# Patient Record
Sex: Female | Born: 1990 | Race: White | Hispanic: No | Marital: Married | Smoking: Never smoker
Health system: Southern US, Community
[De-identification: ages and names within clinical notes are randomized; demographics above are authoritative.]

## PROBLEM LIST (undated history)

## (undated) DIAGNOSIS — E7800 Pure hypercholesterolemia, unspecified: Secondary | ICD-10-CM

## (undated) DIAGNOSIS — F419 Anxiety disorder, unspecified: Secondary | ICD-10-CM

## (undated) DIAGNOSIS — J302 Other seasonal allergic rhinitis: Secondary | ICD-10-CM

## (undated) DIAGNOSIS — L409 Psoriasis, unspecified: Secondary | ICD-10-CM

## (undated) DIAGNOSIS — Z86718 Personal history of other venous thrombosis and embolism: Secondary | ICD-10-CM

## (undated) DIAGNOSIS — G4730 Sleep apnea, unspecified: Secondary | ICD-10-CM

## (undated) DIAGNOSIS — J45909 Unspecified asthma, uncomplicated: Secondary | ICD-10-CM

## (undated) DIAGNOSIS — F32A Depression, unspecified: Secondary | ICD-10-CM

## (undated) DIAGNOSIS — N941 Unspecified dyspareunia: Secondary | ICD-10-CM

## (undated) DIAGNOSIS — M1990 Unspecified osteoarthritis, unspecified site: Secondary | ICD-10-CM

## (undated) DIAGNOSIS — T7840XA Allergy, unspecified, initial encounter: Secondary | ICD-10-CM

## (undated) DIAGNOSIS — N3010 Interstitial cystitis (chronic) without hematuria: Secondary | ICD-10-CM

## (undated) DIAGNOSIS — E559 Vitamin D deficiency, unspecified: Secondary | ICD-10-CM

## (undated) DIAGNOSIS — Z8619 Personal history of other infectious and parasitic diseases: Secondary | ICD-10-CM

## (undated) DIAGNOSIS — E538 Deficiency of other specified B group vitamins: Secondary | ICD-10-CM

## (undated) HISTORY — DX: Unspecified osteoarthritis, unspecified site: M19.90

## (undated) HISTORY — DX: Depression, unspecified: F32.A

## (undated) HISTORY — DX: Allergy, unspecified, initial encounter: T78.40XA

## (undated) HISTORY — DX: Interstitial cystitis (chronic) without hematuria: N30.10

## (undated) HISTORY — DX: Personal history of other infectious and parasitic diseases: Z86.19

## (undated) HISTORY — DX: Deficiency of other specified B group vitamins: E53.8

## (undated) HISTORY — DX: Unspecified asthma, uncomplicated: J45.909

## (undated) HISTORY — PX: FRACTURE SURGERY: SHX138

## (undated) HISTORY — PX: CERVICAL BIOPSY  W/ LOOP ELECTRODE EXCISION: SUR135

## (undated) HISTORY — DX: Vitamin D deficiency, unspecified: E55.9

## (undated) HISTORY — DX: Personal history of other venous thrombosis and embolism: Z86.718

## (undated) HISTORY — DX: Psoriasis, unspecified: L40.9

## (undated) HISTORY — DX: Pure hypercholesterolemia, unspecified: E78.00

## (undated) HISTORY — DX: Anxiety disorder, unspecified: F41.9

## (undated) HISTORY — PX: FOOT SURGERY: SHX648

## (undated) HISTORY — DX: Unspecified dyspareunia: N94.10

## (undated) HISTORY — DX: Other seasonal allergic rhinitis: J30.2

## (undated) HISTORY — DX: Sleep apnea, unspecified: G47.30

---

## 2008-09-25 ENCOUNTER — Ambulatory Visit: Payer: Self-pay | Admitting: Family Medicine

## 2008-09-25 DIAGNOSIS — L03317 Cellulitis of buttock: Secondary | ICD-10-CM

## 2008-09-25 DIAGNOSIS — L0231 Cutaneous abscess of buttock: Secondary | ICD-10-CM

## 2008-09-26 ENCOUNTER — Encounter: Payer: Self-pay | Admitting: Occupational Medicine

## 2009-10-25 ENCOUNTER — Ambulatory Visit: Payer: Self-pay | Admitting: Emergency Medicine

## 2009-10-25 DIAGNOSIS — H60339 Swimmer's ear, unspecified ear: Secondary | ICD-10-CM

## 2010-03-08 NOTE — Letter (Signed)
Summary: CONTROLLED MEDICATIONS PRESCRIPTION POLICY  CONTROLLED MEDICATIONS PRESCRIPTION POLICY   Imported By: Dannette Barbara 10/25/2009 15:49:12  _____________________________________________________________________  External Attachment:    Type:   Image     Comment:   External Document

## 2010-03-08 NOTE — Assessment & Plan Note (Signed)
Summary: BOTH EARS PAINFUL   Vital Signs:  Patient Profile:   20 Years Old Female CC:      Left earache x 2 weeks Height:     60 inches Weight:      180 pounds O2 Sat:      99 % O2 treatment:    Room Air Temp:     98.9 degrees F oral Pulse rate:   85 / minute Pulse rhythm:   regular Resp:     16 per minute BP sitting:   121 / 81  (left arm) Cuff size:   regular  Vitals Entered By: Avel Sensor, CMA                  Current Allergies: ! * LATEXHistory of Present Illness Chief Complaint: Left earache x 2 weeks History of Present Illness: Left earache for 2 weeks.  Very mildly in R ear too.  [place] the beach but doesn't recall getting head wet.  Hurts to press on ear and feels full, as if water is in it.  No F/C/N/V.  Mild sinus congestion, mild URI.  Using Alavert (as per her student health office) but that's not helping.  Current Meds TRI-PREVIFEM 0.18/0.215/0.25 MG-35 MCG TABS (NORGESTIM-ETH ESTRAD TRIPHASIC) as directed CIPRODEX 0.3-0.1 % SUSP (CIPROFLOXACIN-DEXAMETHASONE) 4 drops Left ear two times a day for 10 days  REVIEW OF SYSTEMS Constitutional Symptoms      Denies fever, chills, night sweats, weight loss, weight gain, and fatigue.  Eyes       Complains of eye pain.      Denies change in vision, eye discharge, glasses, contact lenses, and eye surgery. Ear/Nose/Throat/Mouth       Denies hearing loss/aids, change in hearing, ear pain, ear discharge, dizziness, frequent runny nose, frequent nose bleeds, sinus problems, sore throat, hoarseness, and tooth pain or bleeding.  Respiratory       Denies dry cough, productive cough, wheezing, shortness of breath, asthma, bronchitis, and emphysema/COPD.  Cardiovascular       Denies murmurs, chest pain, and tires easily with exhertion.    Gastrointestinal       Denies stomach pain, nausea/vomiting, diarrhea, constipation, blood in bowel movements, and indigestion. Genitourniary       Denies painful urination, kidney stones,  and loss of urinary control. Neurological       Denies paralysis, seizures, and fainting/blackouts. Musculoskeletal       Denies muscle pain, joint pain, joint stiffness, decreased range of motion, redness, swelling, muscle weakness, and gout.  Skin       Denies bruising, unusual mles/lumps or sores, and hair/skin or nail changes.  Psych       Denies mood changes, temper/anger issues, anxiety/stress, speech problems, depression, and sleep problems.  Past History:  Past Medical History: Reviewed history from 09/25/2008 and no changes required. Unremarkable  Past Surgical History: Reviewed history from 09/25/2008 and no changes required. Denies surgical history  Family History: Reviewed history from 09/25/2008 and no changes required. mother with high blood pressure  Social History: denies drinking denies smoking denies recreational drug use Student [employer] Physical Exam General appearance: well developed, well nourished, no acute distress Ears: Left canal is mildly erythematous, no cerumen.  TM is normal.  R ear is normal Nasal: mucosa pink, nonedematous, no septal deviation, turbinates normal Oral/Pharynx: tongue normal, posterior pharynx without erythema or exudate Chest/Lungs: no rales, wheezes, or rhonchi bilateral, breath sounds equal without effort Heart: regular rate and  rhythm, no murmur Skin:  no obvious rashes or lesions MSE: oriented to time, place, and person Assessment New Problems: OTITIS EXTERNA, ACUTE, LEFT (ICD-380.12)   Patient Education: Patient and/or caregiver instructed in the following: rest, fluids, Ibuprofen prn.  Plan New Medications/Changes: CIPRODEX 0.3-0.1 % SUSP (CIPROFLOXACIN-DEXAMETHASONE) 4 drops Left ear two times a day for 10 days  #QS x 0, 10/25/2009, Hoyt Koch MD  New Orders: Est. Patient Level II [phone] Planning Comments:   Follow-up with your primary care physician if not improving in 1 week.  No swimming and no  Qtips.   The patient and/or caregiver has been counseled thoroughly with regard to medications prescribed including dosage, schedule, interactions, rationale for use, and possible side effects and they verbalize understanding.  Diagnoses and expected course of recovery discussed and will return if not improved as expected or if the condition worsens. Patient and/or caregiver verbalized understanding.  Prescriptions: CIPRODEX 0.3-0.1 % SUSP (CIPROFLOXACIN-DEXAMETHASONE) 4 drops Left ear two times a day for 10 days  #QS x 0   Entered and Authorized by:   Hoyt Koch MD   Signed by:   Hoyt Koch MD on 10/25/2009   Method used:   Print then Give to Patient   RxID:   [id number]   Orders Added: 1)  Est. Patient Level II [29528]

## 2013-08-23 ENCOUNTER — Ambulatory Visit (INDEPENDENT_AMBULATORY_CARE_PROVIDER_SITE_OTHER): Payer: BC Managed Care – PPO | Admitting: Family Medicine

## 2013-08-23 VITALS — BP 118/86 | HR 94 | Temp 98.7°F | Resp 18 | Ht 61.0 in | Wt 202.0 lb

## 2013-08-23 DIAGNOSIS — Z975 Presence of (intrauterine) contraceptive device: Secondary | ICD-10-CM

## 2013-08-23 DIAGNOSIS — N3000 Acute cystitis without hematuria: Secondary | ICD-10-CM

## 2013-08-23 DIAGNOSIS — Z309 Encounter for contraceptive management, unspecified: Secondary | ICD-10-CM

## 2013-08-23 DIAGNOSIS — R300 Dysuria: Secondary | ICD-10-CM

## 2013-08-23 LAB — POCT UA - MICROSCOPIC ONLY
Casts, Ur, LPF, POC: NEGATIVE
Crystals, Ur, HPF, POC: NEGATIVE
Mucus, UA: NEGATIVE
Yeast, UA: NEGATIVE

## 2013-08-23 LAB — POCT URINALYSIS DIPSTICK
Bilirubin, UA: NEGATIVE
Glucose, UA: NEGATIVE
Ketones, UA: NEGATIVE
Leukocytes, UA: NEGATIVE
Nitrite, UA: NEGATIVE
Protein, UA: NEGATIVE
Spec Grav, UA: 1.015
Urobilinogen, UA: 0.2
pH, UA: 7

## 2013-08-23 MED ORDER — SULFAMETHOXAZOLE-TMP DS 800-160 MG PO TABS: 1.0000 | ORAL_TABLET | Freq: Two times a day (BID) | ORAL | Status: DC

## 2013-08-23 MED ORDER — PHENAZOPYRIDINE HCL 100 MG PO TABS: 100.0000 mg | ORAL_TABLET | Freq: Three times a day (TID) | ORAL | Status: DC | PRN

## 2013-08-23 NOTE — Patient Instructions (Signed)
Drink plenty of fluids  Take the medication twice daily for infection  Take pyridium as directed

## 2013-08-23 NOTE — Progress Notes (Signed)
Subjective: 23 year old lady who's been having dysuria for about 8 days. Has had some other mild UTIs in the past. She's been having urinary frequency and urgency and feeling like she needs ago right after going. She is sexually involved, one partner. She's been with him for a long time. She has an implenon which was implanted recently when her IUD was removed. She wants that checked to make sure that the insertion site is good. She's not been having a fever chills or back pains. She has a history of having had a kidney stone.  Objective: Pleasant alert young lady in no acute distress. No CVA tenderness. Abdomen soft without mass is mild suprapubic tenderness. Implenon in her left arm seems to be fine.  Assessment: Dysuria  UTI Contraceptive device plan:  Plan: Bactrim Peridium Reassurance about contraceptive  Results for orders placed in visit on 08/23/13  POCT UA - MICROSCOPIC ONLY      Result Value Ref Range   WBC, Ur, HPF, POC 20-25     RBC, urine, microscopic 3-6     Bacteria, U Microscopic 1+     Mucus, UA neg     Epithelial cells, urine per micros 2-5     Crystals, Ur, HPF, POC neg     Casts, Ur, LPF, POC neg     Yeast, UA neg    POCT URINALYSIS DIPSTICK      Result Value Ref Range   Color, UA yellow     Clarity, UA cloudy     Glucose, UA neg     Bilirubin, UA neg     Ketones, UA neg     Spec Grav, UA 1.015     Blood, UA trace     pH, UA 7.0     Protein, UA neg     Urobilinogen, UA 0.2     Nitrite, UA neg     Leukocytes, UA Negative

## 2013-08-26 ENCOUNTER — Telehealth: Payer: Self-pay

## 2013-08-26 ENCOUNTER — Ambulatory Visit (INDEPENDENT_AMBULATORY_CARE_PROVIDER_SITE_OTHER): Payer: BC Managed Care – PPO | Admitting: Emergency Medicine

## 2013-08-26 VITALS — BP 118/80 | HR 75 | Temp 98.3°F | Resp 16 | Ht 60.5 in | Wt 203.2 lb

## 2013-08-26 DIAGNOSIS — R300 Dysuria: Secondary | ICD-10-CM

## 2013-08-26 DIAGNOSIS — N3000 Acute cystitis without hematuria: Secondary | ICD-10-CM

## 2013-08-26 LAB — URINE CULTURE

## 2013-08-26 MED ORDER — PHENAZOPYRIDINE HCL 200 MG PO TABS: 100.0000 mg | ORAL_TABLET | Freq: Three times a day (TID) | ORAL | Status: DC | PRN

## 2013-08-26 MED ORDER — NITROFURANTOIN MONOHYD MACRO 100 MG PO CAPS: 100.0000 mg | ORAL_CAPSULE | Freq: Two times a day (BID) | ORAL | Status: DC

## 2013-08-26 NOTE — Patient Instructions (Signed)

## 2013-08-26 NOTE — Progress Notes (Signed)
[facility] [address], [place] [address] [phone]  Date:  08/26/2013   Name:  Daisy Singleton   DOB:  03/02/1990   MRN:  [id number]  PCP:  No PCP Per Patient    Chief Complaint: Follow-up, Abdominal Pain, Back Pain, Urinary Frequency and Dizziness   History of Present Illness:  Daisy Singleton is a 23 y.o. very pleasant female patient who presents with the following:  Patient here on Saturday and treated with septra for UTI.  Preliminary culture is 75000 cfu  E.coli with sensitivities pending.  Has dysuria and urgency and frequency.  Says nothing is helping.  No fever or chills, nausea or vomiting. No stool change or vaginal discharge.  No dyspareunia.  No improvement with over the counter medications or other home remedies. Denies other complaint or health concern today.   Patient Active Problem List   Diagnosis Date Noted  . OTITIS EXTERNA, ACUTE, LEFT 10/25/2009  . CELLULITIS, BUTTOCKS 09/25/2008    Past Medical History  Diagnosis Date  . Allergy   . Asthma     History reviewed. No pertinent past surgical history.  History  Substance Use Topics  . Smoking status: Never Smoker   . Smokeless tobacco: Not on file  . Alcohol Use: Not on file    Family History  Problem Relation Age of Onset  . Hyperlipidemia Mother   . Hyperlipidemia Father     Allergies  Allergen Reactions  . Latex     Medication list has been reviewed and updated.  Current Outpatient Prescriptions on File Prior to Visit  Medication Sig Dispense Refill  . Etonogestrel (NEXPLANON [place]) Inject into the skin.      . phenazopyridine (PYRIDIUM) 100 MG tablet Take 1 tablet (100 mg total) by mouth 3 (three) times daily as needed for pain.  10 tablet  0  . sulfamethoxazole-trimethoprim (BACTRIM DS) 800-160 MG per tablet Take 1 tablet by mouth 2 (two) times daily.  14 tablet  0   No current facility-administered medications on file prior to visit.    Review of  Systems:  As per HPI, otherwise negative.    Physical Examination: Filed Vitals:   08/26/13 1411  BP: 118/80  Pulse: 75  Temp: 98.3 F (36.8 C)  Resp: 16   Filed Vitals:   08/26/13 1411  Height: 5' 0.5" (1.537 m)  Weight: 203 lb 3.2 oz (92.171 kg)   Body mass index is 39.02 kg/(m^2). Ideal Body Weight: Weight in (lb) to have BMI = 25: 129.9   GEN: obese, NAD, Non-toxic, Alert & Oriented x 3 HEENT: Atraumatic, Normocephalic.  Ears and Nose: No external deformity. EXTR: No clubbing/cyanosis/edema NEURO: Normal gait.  PSYCH: Normally interactive. Conversant. Not depressed or anxious appearing.  Calm demeanor.  ABD:  Obese, soft and not tender  Assessment and Plan: Cystitis Change to macrobid Follow up based on culture.   Signed,  Arganbright OdorJeffery Suezette Lafave, MD

## 2013-08-26 NOTE — Telephone Encounter (Signed)
Pt still experiencing frequent urination,burning sensation when urinating, and abdominal pain. She would like to know if something else can be prescribed or if she should come back in.    [facility]  [phone]#[phone]

## 2013-08-27 NOTE — Telephone Encounter (Signed)
Per chart pt was seen after this message had been taken.  This is resolved.

## 2013-09-25 ENCOUNTER — Ambulatory Visit (INDEPENDENT_AMBULATORY_CARE_PROVIDER_SITE_OTHER): Payer: BC Managed Care – PPO | Admitting: Family Medicine

## 2013-09-25 VITALS — BP 122/90 | HR 107 | Temp 98.3°F | Resp 16 | Ht 60.75 in | Wt 203.8 lb

## 2013-09-25 DIAGNOSIS — R1011 Right upper quadrant pain: Secondary | ICD-10-CM

## 2013-09-25 DIAGNOSIS — Z3200 Encounter for pregnancy test, result unknown: Secondary | ICD-10-CM

## 2013-09-25 DIAGNOSIS — Z87440 Personal history of urinary (tract) infections: Secondary | ICD-10-CM

## 2013-09-25 DIAGNOSIS — F411 Generalized anxiety disorder: Secondary | ICD-10-CM

## 2013-09-25 DIAGNOSIS — N3000 Acute cystitis without hematuria: Secondary | ICD-10-CM

## 2013-09-25 LAB — COMPREHENSIVE METABOLIC PANEL
ALBUMIN: 4.6 g/dL (ref 3.5–5.2)
ALT: 16 U/L (ref 0–35)
AST: 14 U/L (ref 0–37)
Alkaline Phosphatase: 21 U/L — ABNORMAL LOW (ref 39–117)
BUN: 11 mg/dL (ref 6–23)
CO2: 21 mEq/L (ref 19–32)
Calcium: 9.6 mg/dL (ref 8.4–10.5)
Chloride: 106 mEq/L (ref 96–112)
Creat: 0.8 mg/dL (ref 0.50–1.10)
Glucose, Bld: 105 mg/dL — ABNORMAL HIGH (ref 70–99)
POTASSIUM: 4.1 meq/L (ref 3.5–5.3)
SODIUM: 137 meq/L (ref 135–145)
Total Bilirubin: 0.4 mg/dL (ref 0.2–1.2)
Total Protein: 7.4 g/dL (ref 6.0–8.3)

## 2013-09-25 LAB — POCT UA - MICROSCOPIC ONLY
Casts, Ur, LPF, POC: NEGATIVE
Crystals, Ur, HPF, POC: NEGATIVE
MUCUS UA: POSITIVE
YEAST UA: NEGATIVE

## 2013-09-25 LAB — POCT URINALYSIS DIPSTICK
Bilirubin, UA: NEGATIVE
Blood, UA: NEGATIVE
GLUCOSE UA: NEGATIVE
Ketones, UA: NEGATIVE
NITRITE UA: NEGATIVE
PROTEIN UA: NEGATIVE
Spec Grav, UA: 1.015
UROBILINOGEN UA: 0.2
pH, UA: 5

## 2013-09-25 LAB — POCT CBC
GRANULOCYTE PERCENT: 50.5 % (ref 37–80)
HCT, POC: 42 % (ref 37.7–47.9)
Hemoglobin: 13.8 g/dL (ref 12.2–16.2)
Lymph, poc: 3.1 (ref 0.6–3.4)
MCH, POC: 28.8 pg (ref 27–31.2)
MCHC: 32.8 g/dL (ref 31.8–35.4)
MCV: 87.9 fL (ref 80–97)
MID (CBC): 0.4 (ref 0–0.9)
MPV: 8.6 fL (ref 0–99.8)
PLATELET COUNT, POC: 249 10*3/uL (ref 142–424)
POC GRANULOCYTE: 3.5 (ref 2–6.9)
POC LYMPH PERCENT: 44.1 %L (ref 10–50)
POC MID %: 5.4 %M (ref 0–12)
RBC: 4.78 M/uL (ref 4.04–5.48)
RDW, POC: 13.4 %
WBC: 7 10*3/uL (ref 4.6–10.2)

## 2013-09-25 LAB — LIPASE: LIPASE: 32 U/L (ref 0–75)

## 2013-09-25 LAB — POCT URINE PREGNANCY: Preg Test, Ur: NEGATIVE

## 2013-09-25 MED ORDER — FLUOXETINE HCL 20 MG PO TABS: 20.0000 mg | ORAL_TABLET | Freq: Every day | ORAL | Status: DC

## 2013-09-25 MED ORDER — CIPROFLOXACIN HCL 500 MG PO TABS: 500.0000 mg | ORAL_TABLET | Freq: Two times a day (BID) | ORAL | Status: DC

## 2013-09-25 MED ORDER — LORAZEPAM 0.5 MG PO TABS: 0.5000 mg | ORAL_TABLET | Freq: Two times a day (BID) | ORAL | Status: DC | PRN

## 2013-09-25 NOTE — Progress Notes (Signed)
Subjective: Daisy Singleton is here for several things. She wants to be checked to make sure she is not pregnant. She has concerns about that since she has switched contraceptive methods from the IUD to an implant. She has been having some problems with chronic anxiety. She gets flares up it did give her more troubles at times. A lot centered around her boss, but he is leaving now. She talks about decreased libido. She had to be retreated for her urinary tract infection, that resolved. She's been having problems with right upper quadrant abdominal tenderness and pain. No dietary triggers. Hurts more in the morning. No nausea or vomiting. No does have a history of cholelithiasis.  Objective: Overweight young lady in no major distress. No thyromegaly. Throat clear. Chest clear. Heart regular without murmurs. Abdomen has normal bowel sounds. Soft without masses. Tender right upper quadrant. Chest wall nontender. No CVA tenderness.  Assessment: Right upper quadrant abdominal pain Status post urinary tract infection Anxiety Fear of pregnancy  Plan Offered to place her on an SSRI. She declined. We'll give her some lorazepam for when necessary use only.  Order gallbladder ultrasound and blood work. If the gallbladder is normal consider a trial of a PPI and/or referral to gastroenterology.  Results for orders placed in visit on 09/25/13  POCT URINE PREGNANCY      Result Value Ref Range   Preg Test, Ur Negative    POCT URINALYSIS DIPSTICK      Result Value Ref Range   Color, UA yellow     Clarity, UA cloudy     Glucose, UA neg     Bilirubin, UA neg     Ketones, UA neg     Spec Grav, UA 1.015     Blood, UA neg     pH, UA 5.0     Protein, UA neg     Urobilinogen, UA 0.2     Nitrite, UA neg     Leukocytes, UA small (1+)    POCT UA - MICROSCOPIC ONLY      Result Value Ref Range   WBC, Ur, HPF, POC 10-12     RBC, urine, microscopic 2-3     Bacteria, U Microscopic 3+     Mucus, UA positive     Epithelial cells, urine per micros 4-8     Crystals, Ur, HPF, POC neg     Casts, Ur, LPF, POC neg     Yeast, UA neg    POCT CBC      Result Value Ref Range   WBC 7.0  4.6 - 10.2 K/uL   Lymph, poc 3.1  0.6 - 3.4   POC LYMPH PERCENT 44.1  10 - 50 %L   MID (cbc) 0.4  0 - 0.9   POC MID % 5.4  0 - 12 %M   POC Granulocyte 3.5  2 - 6.9   Granulocyte percent 50.5  37 - 80 %G   RBC 4.78  4.04 - 5.48 M/uL   Hemoglobin 13.8  12.2 - 16.2 g/dL   HCT, POC 40.942.0  81.137.7 - 47.9 %   MCV 87.9  80 - 97 fL   MCH, POC 28.8  27 - 31.2 pg   MCHC 32.8  31.8 - 35.4 g/dL   RDW, POC 91.413.4     Platelet Count, POC 249  142 - 424 K/uL   MPV 8.6  0 - 99.8 fL

## 2013-09-25 NOTE — Patient Instructions (Signed)
Takes lorazepam only when needed for anxiety or for a particularly stressful event.  Exercise  Gallbladder ultrasound and labs will be ordered and you will be informed of the results.  Return if abruptly worse at anytime

## 2013-10-03 ENCOUNTER — Ambulatory Visit
Admission: RE | Admit: 2013-10-03 | Discharge: 2013-10-03 | Disposition: A | Discharge status: Home / Self Care | Payer: BC Managed Care – PPO | Source: Ambulatory Visit | Attending: Family Medicine | Admitting: Family Medicine

## 2013-10-06 ENCOUNTER — Telehealth: Payer: Self-pay

## 2013-10-06 NOTE — Telephone Encounter (Signed)
Pt says we had referred her to [facility] and wants to know if her results are available. [phone]

## 2013-10-08 NOTE — Telephone Encounter (Signed)
Pt calling for imaging results. Please review. Thanks

## 2014-03-02 ENCOUNTER — Other Ambulatory Visit (HOSPITAL_COMMUNITY): Payer: Self-pay | Admitting: Orthopaedic Surgery

## 2014-03-02 ENCOUNTER — Ambulatory Visit (HOSPITAL_COMMUNITY)
Admission: RE | Admit: 2014-03-02 | Discharge: 2014-03-02 | Disposition: A | Discharge status: Home / Self Care | Payer: BC Managed Care – PPO | Source: Ambulatory Visit | Attending: Orthopaedic Surgery | Admitting: Orthopaedic Surgery

## 2014-03-02 DIAGNOSIS — M79605 Pain in left leg: Secondary | ICD-10-CM | POA: Diagnosis present

## 2014-03-02 DIAGNOSIS — M25562 Pain in left knee: Secondary | ICD-10-CM

## 2014-03-02 DIAGNOSIS — M7989 Other specified soft tissue disorders: Secondary | ICD-10-CM

## 2014-03-02 DIAGNOSIS — M79609 Pain in unspecified limb: Secondary | ICD-10-CM

## 2014-03-02 NOTE — Progress Notes (Signed)
*  Preliminary Results* Left lower extremity venous duplex completed. 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 There is no evidence of left Baker's cyst.  Preliminary results discussed with Dr.Dalldorf.  03/02/2014 12:05 PM  Gertie FeyMichelle Gilberta Peeters, RVT, RDCS, RDMS

## 2014-08-06 ENCOUNTER — Ambulatory Visit (INDEPENDENT_AMBULATORY_CARE_PROVIDER_SITE_OTHER): Payer: BC Managed Care – PPO | Admitting: Psychology

## 2014-08-06 DIAGNOSIS — F411 Generalized anxiety disorder: Secondary | ICD-10-CM

## 2014-08-20 ENCOUNTER — Ambulatory Visit (INDEPENDENT_AMBULATORY_CARE_PROVIDER_SITE_OTHER): Payer: BC Managed Care – PPO | Admitting: Psychology

## 2014-08-20 DIAGNOSIS — F411 Generalized anxiety disorder: Secondary | ICD-10-CM | POA: Diagnosis not present

## 2015-02-05 ENCOUNTER — Ambulatory Visit (INDEPENDENT_AMBULATORY_CARE_PROVIDER_SITE_OTHER): Payer: BC Managed Care – PPO | Admitting: Family Medicine

## 2015-02-05 VITALS — BP 112/84 | HR 97 | Temp 97.5°F | Resp 16 | Ht 61.5 in | Wt 201.5 lb

## 2015-02-05 DIAGNOSIS — J069 Acute upper respiratory infection, unspecified: Secondary | ICD-10-CM | POA: Diagnosis not present

## 2015-02-05 MED ORDER — PSEUDOEPHEDRINE HCL ER 120 MG PO TB12: 120.0000 mg | ORAL_TABLET | Freq: Two times a day (BID) | ORAL | Status: DC

## 2015-02-05 MED ORDER — IPRATROPIUM BROMIDE 0.03 % NA SOLN: 2.0000 | Freq: Four times a day (QID) | NASAL | Status: DC

## 2015-02-05 MED ORDER — HYDROCOD POLST-CPM POLST ER 10-8 MG/5ML PO SUER: 5.0000 mL | Freq: Two times a day (BID) | ORAL | Status: DC | PRN

## 2015-02-05 NOTE — Progress Notes (Signed)
Subjective:    Patient ID: Daisy Singleton, female    DOB: 1990/08/01, 24 y.o.   MRN: [id number] By signing my name below, I, Littie Deeds, attest that this documentation has been prepared under the direction and in the presence of Norberto Sorenson, MD.  Electronically Signed: Littie Deeds, Medical Scribe. 02/05/2015. 10:08 AM.  Chief Complaint  Patient presents with  . Nasal Congestion  . Generalized Body Aches  . Sinusitis  . Cough    non productive  . Sore Throat  . other    patient can not remember if she had flu shot or not    HPI HPI Comments: Daisy Singleton is a 24 y.o. female who presents to the [facility] complaining of gradual onset, progressively worsening, dry cough that started 4 days ago. Patient reports having associated chills, generalized myalgias, scratchy throat, congestion, postnasal drip, and adenopathy. She has tried Robitussin, Dayquil, nasal spray, and Tylenol for her symptoms. Patient denies fever and headache. She also denies smoking and history of asthma. She has been eating, drinking, urinating, and making bowel movements normally. She notes that her husband became ill 3-4 days before her symptoms began. He was diagnosed with tonsillitis and was treated with antibiotics, cough syrup, and an inhaler. His strep test was negative. Patient notes that her tonsils do not look like her husband's. She notes that she has a mild allergy to cortisone, which causes her to become red, flushed, and near-syncopal.  Past Medical History  Diagnosis Date  . Allergy   . Asthma    Current Outpatient Prescriptions on File Prior to Visit  Medication Sig Dispense Refill  . Etonogestrel (NEXPLANON [place]) Inject into the skin.     No current facility-administered medications on file prior to visit.   Allergies  Allergen Reactions  . Latex   . Cortisone Hives    Red flush     Review of Systems  Constitutional: Positive for chills. Negative for fever and appetite  change.  HENT: Positive for congestion, postnasal drip and sore throat.   Respiratory: Positive for cough.   Gastrointestinal: Negative for diarrhea and constipation.  Genitourinary: Negative.   Musculoskeletal: Positive for myalgias.  Neurological: Negative for headaches.  Hematological: Positive for adenopathy.       Objective:  BP 112/84 mmHg  Pulse 97  Temp(Src) 97.5 F (36.4 C) (Oral)  Resp 16  Ht 5' 1.5" (1.562 m)  Wt 201 lb 8 oz (91.4 kg)  BMI 37.46 kg/m2  SpO2 98%  Physical Exam  Constitutional: She is oriented to person, place, and time. She appears well-developed and well-nourished. No distress.  HENT:  Head: Normocephalic and atraumatic.  Right Ear: A middle ear effusion is present.  Left Ear: A middle ear effusion is present.  Nose: Mucosal edema and rhinorrhea present.  Mouth/Throat: Posterior oropharyngeal erythema present. No oropharyngeal exudate.  Nasal edema, erythema, and clear rhinorrhea. Oropharynx with erythema, 1+ tonsils.  Eyes: Pupils are equal, round, and reactive to light.  Neck: Neck supple. No thyromegaly present.  Normal thyroid.  Cardiovascular: Normal rate, regular rhythm, S1 normal, S2 normal and normal heart sounds.   No murmur heard. Pulmonary/Chest: Effort normal and breath sounds normal. No respiratory distress.  Clear to auscultation bilaterally.   Musculoskeletal: She exhibits no edema.  Lymphadenopathy:       Head (right side): Tonsillar adenopathy present.       Head (left side): Tonsillar adenopathy present.    She has cervical adenopathy.  Right cervical: No posterior cervical adenopathy present.      Left cervical: No posterior cervical adenopathy present.       Right: No supraclavicular adenopathy present.       Left: No supraclavicular adenopathy present.  Tonsillar and anterior cervical adenopathy.  Neurological: She is alert and oriented to person, place, and time. No cranial nerve deficit.  Skin: Skin is warm and  dry. No rash noted.  Psychiatric: She has a normal mood and affect. Her behavior is normal.  Nursing note and vitals reviewed.         Assessment & Plan:  If patient worsens or does not improve, she will call in and we'll send in Z-pak. 1. Acute upper respiratory infection     Meds ordered this encounter  Medications  . chlorpheniramine-HYDROcodone (TUSSIONEX PENNKINETIC ER) 10-8 MG/5ML SUER    Sig: Take 5 mLs by mouth every 12 (twelve) hours as needed.    Dispense:  120 mL    Refill:  0  . ipratropium (ATROVENT) 0.03 % nasal spray    Sig: Place 2 sprays into the nose 4 (four) times daily.    Dispense:  30 mL    Refill:  1  . pseudoephedrine (SUDAFED 12 HOUR) 120 MG 12 hr tablet    Sig: Take 1 tablet (120 mg total) by mouth 2 (two) times daily.    Dispense:  30 tablet    Refill:  0    I personally performed the services described in this documentation, which was scribed in my presence. The recorded information has been reviewed and considered, and addended by me as needed.  Norberto SorensonEva Rambo Sarafian, MD MPH

## 2015-02-05 NOTE — Patient Instructions (Signed)
I recommend frequent warm salt water gargles, hot tea with honey and lemon, rest, and handwashing.  Hot showers or breathing in steam may help loosen the congestion.  Try a netti pot or sinus rinse is also likely to help you feel better and keep this from progressing.  Upper Respiratory Infection, Adult Most upper respiratory infections (URIs) are a viral infection of the air passages leading to the lungs. A URI affects the nose, throat, and upper air passages. The most common type of URI is nasopharyngitis and is typically referred to as "the common cold." URIs run their course and usually go away on their own. Most of the time, a URI does not require medical attention, but sometimes a bacterial infection in the upper airways can follow a viral infection. This is called a secondary infection. Sinus and middle ear infections are common types of secondary upper respiratory infections. Bacterial pneumonia can also complicate a URI. A URI can worsen asthma and chronic obstructive pulmonary disease (COPD). Sometimes, these complications can require emergency medical care and may be life threatening.  CAUSES Almost all URIs are caused by viruses. A virus is a type of germ and can spread from one person to another.  RISKS FACTORS You may be at risk for a URI if:   You smoke.   You have chronic heart or lung disease.  You have a weakened defense (immune) system.   You are very young or very old.   You have nasal allergies or asthma.  You work in crowded or poorly ventilated areas.  You work in health care facilities or schools. SIGNS AND SYMPTOMS  Symptoms typically develop 2-3 days after you come in contact with a cold virus. Most viral URIs last 7-10 days. However, viral URIs from the influenza virus (flu virus) can last 14-18 days and are typically more severe. Symptoms may include:   Runny or stuffy (congested) nose.   Sneezing.   Cough.   Sore throat.   Headache.   Fatigue.    Fever.   Loss of appetite.   Pain in your forehead, behind your eyes, and over your cheekbones (sinus pain).  Muscle aches.  DIAGNOSIS  Your health care provider may diagnose a URI by:  Physical exam.  Tests to check that your symptoms are not due to another condition such as:  Strep throat.  Sinusitis.  Pneumonia.  Asthma. TREATMENT  A URI goes away on its own with time. It cannot be cured with medicines, but medicines may be prescribed or recommended to relieve symptoms. Medicines may help:  Reduce your fever.  Reduce your cough.  Relieve nasal congestion. HOME CARE INSTRUCTIONS   Take medicines only as directed by your health care provider.   Gargle warm saltwater or take cough drops to comfort your throat as directed by your health care provider.  Use a warm mist humidifier or inhale steam from a shower to increase air moisture. This may make it easier to breathe.  Drink enough fluid to keep your urine clear or pale yellow.   Eat soups and other clear broths and maintain good nutrition.   Rest as needed.   Return to work when your temperature has returned to normal or as your health care provider advises. You may need to stay home longer to avoid infecting others. You can also use a face mask and careful hand washing to prevent spread of the virus.  Increase the usage of your inhaler if you have asthma.   Do  not use any tobacco products, including cigarettes, chewing tobacco, or electronic cigarettes. If you need help quitting, ask your health care provider. PREVENTION  The best way to protect yourself from getting a cold is to practice good hygiene.   Avoid oral or hand contact with people with cold symptoms.   Wash your hands often if contact occurs.  There is no clear evidence that vitamin C, vitamin E, echinacea, or exercise reduces the chance of developing a cold. However, it is always recommended to get plenty of rest, exercise, and  practice good nutrition.  SEEK MEDICAL CARE IF:   You are getting worse rather than better.   Your symptoms are not controlled by medicine.   You have chills.  You have worsening shortness of breath.  You have brown or red mucus.  You have yellow or brown nasal discharge.  You have pain in your face, especially when you bend forward.  You have a fever.  You have swollen neck glands.  You have pain while swallowing.  You have white areas in the back of your throat. SEEK IMMEDIATE MEDICAL CARE IF:   You have severe or persistent:  Headache.  Ear pain.  Sinus pain.  Chest pain.  You have chronic lung disease and any of the following:  Wheezing.  Prolonged cough.  Coughing up blood.  A change in your usual mucus.  You have a stiff neck.  You have changes in your:  Vision.  Hearing.  Thinking.  Mood. MAKE SURE YOU:   Understand these instructions.  Will watch your condition.  Will get help right away if you are not doing well or get worse.   This information is not intended to replace advice given to you by your health care provider. Make sure you discuss any questions you have with your health care provider.   Document Released: 07/19/2000 Document Revised: 06/09/2014 Document Reviewed: 04/30/2013 Elsevier Interactive Patient Education [employer]2016 [employer]

## 2015-02-11 ENCOUNTER — Ambulatory Visit (INDEPENDENT_AMBULATORY_CARE_PROVIDER_SITE_OTHER): Payer: BC Managed Care – PPO | Admitting: Family Medicine

## 2015-02-11 VITALS — BP 122/72 | HR 109 | Temp 98.2°F | Resp 17 | Ht 61.0 in | Wt 200.0 lb

## 2015-02-11 DIAGNOSIS — R05 Cough: Secondary | ICD-10-CM

## 2015-02-11 DIAGNOSIS — J0100 Acute maxillary sinusitis, unspecified: Secondary | ICD-10-CM

## 2015-02-11 DIAGNOSIS — J0110 Acute frontal sinusitis, unspecified: Secondary | ICD-10-CM

## 2015-02-11 DIAGNOSIS — R059 Cough, unspecified: Secondary | ICD-10-CM

## 2015-02-11 MED ORDER — AMOXICILLIN 875 MG PO TABS: 875.0000 mg | ORAL_TABLET | Freq: Two times a day (BID) | ORAL | Status: DC

## 2015-02-11 MED ORDER — BENZONATATE 100 MG PO CAPS: 100.0000 mg | ORAL_CAPSULE | Freq: Three times a day (TID) | ORAL | Status: DC | PRN

## 2015-02-11 NOTE — Progress Notes (Signed)
Patient ID: Daisy Singleton, female    DOB: 02/01/1991  Age: 25 y.o. MRN: [id number]  Chief Complaint  Patient presents with  . Cough  . Nasal Congestion  . Sinusitis    Subjective:  25 year old lady who usually is pretty healthy. She has had a respiratory tract infection since last week. She saw Dr. Clelia CroftShaw and was treated with some cough syrup. By Monday she thought she is doing a little bit better. However since then she's gotten worse again. She has facial pressure and pain and is having postnasal drainage. However when she uses the nose spray helps that a good deal. She's been coughing, but when she can control the drainage with adoesn't cough much. The cough syrup makes her too drowsy to function in the daytime. She's not been febrile but she just feels yucky. She has an implant and is not pregnant.   Current allergies, medications, problem list, past/family and social histories reviewed.  Objective:  BP 122/72 mmHg  Pulse 109  Temp(Src) 98.2 F (36.8 C) (Oral)  Resp 17  Ht 5\' 1"  (1.549 m)  Wt 200 lb (90.719 kg)  BMI 37.81 kg/m2  SpO2 97%  Pleasant lady who does not feel well. Her TMs are normal. Tender right maxillary and frontal areas. Throat is not particularly red but has swelling of the tonsillar tissues more on the right than the left. The neck supple and has some swelling of the submandibular glands on the right. Her chest is clear to auscultation. Heart regular without murmur.  Assessment & Plan:   Assessment: No diagnosis found.    Plan: Sinusitis/cough. Will give some benzonatate for daytime coughing, continue using the cough syrup at night if needed, and treat the sinuses with amoxicillin 875 one twice daily.  No orders of the defined types were placed in this encounter.    No orders of the defined types were placed in this encounter.         There are no Patient Instructions on file for this visit.   No Follow-up on file.   HOPPER,DAVID, MD  02/11/2015

## 2015-02-11 NOTE — Patient Instructions (Addendum)
Drink plenty of fluids and get enough rest  Continue using the cough syrup as needed at bedtime.  Take the benzonatate cough pills one or 2 pills 3 times daily as needed for daytime cough  Take amoxicillin 875 mg twice daily for infection in the sinuses  Continue using the ipratropium nose spray  Return if getting worse with increased pain, increased cough, or high fevers.

## 2015-08-30 LAB — HM COLONOSCOPY

## 2015-09-13 ENCOUNTER — Other Ambulatory Visit: Payer: Self-pay | Admitting: Physician Assistant

## 2015-09-13 DIAGNOSIS — R1031 Right lower quadrant pain: Secondary | ICD-10-CM

## 2015-09-21 ENCOUNTER — Ambulatory Visit
Admission: RE | Admit: 2015-09-21 | Discharge: 2015-09-21 | Disposition: A | Discharge status: Home / Self Care | Payer: BC Managed Care – PPO | Source: Ambulatory Visit | Attending: Physician Assistant | Admitting: Physician Assistant

## 2015-09-21 DIAGNOSIS — R1031 Right lower quadrant pain: Secondary | ICD-10-CM

## 2015-09-21 MED ORDER — IOPAMIDOL (ISOVUE-300) INJECTION 61%
100.0000 mL | Freq: Once | INTRAVENOUS | Status: AC | PRN
Start: 2015-09-21 — End: 2015-09-21
  Administered 2015-09-21: 100 mL via INTRAVENOUS

## 2016-06-28 LAB — HM PAP SMEAR: HM Pap smear: NEGATIVE

## 2017-04-01 NOTE — Progress Notes (Signed)
Subjective:    Patient ID: Daisy Singleton, female    DOB: 10/10/1990, 27 y.o.   MRN: [id number]  HPI Chief Complaint  Patient presents with  . new pt    new pt, fasting cpe, sees obgyn. sees and dermatology and was given info and just has info   She is new to the practice and here for a complete physical exam and to discuss multiple health conditions.  Previous medical care: no PCP in years. [facility]. She has seen multiple specialists.   Other providers: [facility] Dr. Marcelle OverlieHolland Dermatologist- she would like to change to a new one. Has been going to [facility] [address].   States she was diagnosed with psoriasis 2 weeks ago and started on topical medication that appears to be working for her. She would like to have me refill this as long as it is working for her.  Psychiatrist - cannot recall his name.  Urologist- reports being diagnosed with IC   History of anxiety -is seeing her psychiatrist and takes lorazepam PRN.  She is having very vivid dreams and wakes up tired.  Stopped sertraline recently with the advice of her psychiatrist.   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   Seasonal allergies. States she does not need medication for this.  Exercised induced asthma. No flare ups. Does not want a rescue inhaler.  States she was diagnosed with psoriasis 2 weeks ago. This is mainly in her hair. She is taking medication for this.  States she is losing her hair but only in the spot where the psoriasis is located on her scalp.   Reports having a "small" LLE DVT a few years ago after a broken leg. States she was immobile, had trauma and was on birth control. States she was told this was the perfect set up for this. She reports only taking aspirin in the past for this. Has not had an issue since.   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  Diet: "I like to eat".  Excerise: yoga    Immunizations: Tdap 2013. Flu shot up to date.   Health maintenance:  Mammogram: N/A Colonoscopy: due to painful sex per patient. [facility] last [facility].  Last Gynecological Exam: up to date Last Menstrual cycle: Nexplanon Pregnancies: 0 Last Dental Exam: December 2018 Last Eye Exam: last eye exam.   Wears seatbelt always, uses sunscreen, smoke detectors in home and functioning, does not text while driving and feels safe in home environment.   Reviewed allergies, medications, past medical, surgical, family, and social history.   Review of Systems Review of Systems Constitutional: -fever, -chills, -sweats, -unexpected weight change,-fatigue ENT: -runny nose, -ear pain, -sore throat Cardiology:  -chest pain, -palpitations, -edema Respiratory: -cough, -shortness of breath, -wheezing Gastroenterology: -abdominal pain, -nausea, -vomiting, -diarrhea, -constipation  Hematology: -bleeding or bruising problems Musculoskeletal: -arthralgias, -myalgias, -joint swelling, -back pain Ophthalmology: -vision changes Urology: -dysuria, -difficulty urinating, -hematuria, -urinary frequency, -urgency Neurology: -headache, -weakness, -tingling, -numbness       Objective:   Physical Exam BP 120/80   Pulse 73   Ht 5\' 1"  (1.549 m)   Wt 200 lb 9.6 oz (91 kg)   BMI 37.90 kg/m   General Appearance:    Alert, cooperative, no distress, appears stated age  Head:    Normocephalic, without obvious abnormality, atraumatic  Eyes:    PERRL, conjunctiva/corneas clear, EOM's intact,  fundi    benign  Ears:    Normal TM's and external ear canals  Nose:   Nares normal, mucosa normal, no drainage or sinus   tenderness  Throat:   Lips, mucosa, and tongue normal; teeth and gums normal  Neck:   Supple, no lymphadenopathy;  thyroid:  no   enlargement/tenderness/nodules; no carotid   bruit or JVD  Back:    Spine nontender, no curvature, ROM normal, no CVA     tenderness  Lungs:     Clear to  auscultation bilaterally without wheezes, rales or     ronchi; respirations unlabored  Chest Wall:    No tenderness or deformity   Heart:    Regular rate and rhythm, S1 and S2 normal, no murmur, rub   or gallop  Breast Exam:    OB/GYN  Abdomen:     Soft, non-tender, nondistended, normoactive bowel sounds,    no masses, no hepatosplenomegaly  Genitalia:    OB/GYN  Rectal:    Not performed due to age<29 and no related complaints  Extremities:   No clubbing, cyanosis or edema  Pulses:   2+ and symmetric all extremities  Skin:   Skin color, texture, turgor normal, dry and scaly patch with some hair loss on her posterior scalp, no sign of infection  Lymph nodes:   Cervical, supraclavicular, and axillary nodes normal  Neurologic:   CNII-XII intact, normal strength, sensation and gait; reflexes 2+ and symmetric throughout          Psych:   Normal mood, affect, hygiene and grooming.     Urinalysis dipstick: negative        Assessment & Plan:  Routine general medical examination at a health care facility - Plan: CBC with Differential/Platelet, Comprehensive metabolic panel, POCT Urinalysis DIP (Proadvantage Device), TSH, Lipid panel  History of HPV infection  Mild intermittent asthma, unspecified whether complicated  Seasonal allergies  Psoriasis  Interstitial cystitis  History of DVT of lower extremity  Obesity (BMI 30-39.9) - Plan: TSH, Lipid panel  Screening for lipid disorders - Plan: Lipid panel  She will continue seeing her OB/GYN for annual exams due to recent LEEP procedure.  Asthma appears to no longer be an issue for her. She declines an albuterol inhaler today.  Seasonal allergies appear to be a non issue as well.  Fairly new diagnosis of psoriasis. Handout given regarding condition and website to look up information. No sign of a flare and the topical medication appears to be working. Will refill this when needed and if not well controlled, we will refer her to a new  dermatologist.  IC- no records on this. She has a urologist if needed.  History of "small DVT" in the past and took aspirin per patient report. This is unclear. Discussed signs and symptoms of DVT and how to prevent in the future.  Obesity counseling done and offered for her to use a free app such as My Fitness Pal, cut back on portion sizes, carbohydrates and increase physical activity.  She is fasting and would like her cholesterol checked.  Discussed safety and health promotion.  Immunizations up to date per patient.  Follow up pending labs.

## 2017-04-02 ENCOUNTER — Encounter: Payer: Self-pay | Admitting: Family Medicine

## 2017-04-02 ENCOUNTER — Ambulatory Visit: Payer: BC Managed Care – PPO | Admitting: Family Medicine

## 2017-04-02 VITALS — BP 120/80 | HR 73 | Ht 61.0 in | Wt 200.6 lb

## 2017-04-02 DIAGNOSIS — Z0000 Encounter for general adult medical examination without abnormal findings: Secondary | ICD-10-CM | POA: Diagnosis not present

## 2017-04-02 DIAGNOSIS — Z8619 Personal history of other infectious and parasitic diseases: Secondary | ICD-10-CM | POA: Diagnosis not present

## 2017-04-02 DIAGNOSIS — L409 Psoriasis, unspecified: Secondary | ICD-10-CM | POA: Diagnosis not present

## 2017-04-02 DIAGNOSIS — E669 Obesity, unspecified: Secondary | ICD-10-CM

## 2017-04-02 DIAGNOSIS — Z13220 Encounter for screening for lipoid disorders: Secondary | ICD-10-CM

## 2017-04-02 DIAGNOSIS — T7840XA Allergy, unspecified, initial encounter: Secondary | ICD-10-CM | POA: Insufficient documentation

## 2017-04-02 DIAGNOSIS — Z86718 Personal history of other venous thrombosis and embolism: Secondary | ICD-10-CM | POA: Insufficient documentation

## 2017-04-02 DIAGNOSIS — J4520 Mild intermittent asthma, uncomplicated: Secondary | ICD-10-CM | POA: Diagnosis not present

## 2017-04-02 DIAGNOSIS — J302 Other seasonal allergic rhinitis: Secondary | ICD-10-CM

## 2017-04-02 DIAGNOSIS — J45909 Unspecified asthma, uncomplicated: Secondary | ICD-10-CM | POA: Insufficient documentation

## 2017-04-02 DIAGNOSIS — N3010 Interstitial cystitis (chronic) without hematuria: Secondary | ICD-10-CM | POA: Insufficient documentation

## 2017-04-02 LAB — POCT URINALYSIS DIP (PROADVANTAGE DEVICE)
BILIRUBIN UA: NEGATIVE mg/dL
Bilirubin, UA: NEGATIVE
Blood, UA: NEGATIVE
GLUCOSE UA: NEGATIVE mg/dL
Leukocytes, UA: NEGATIVE
Nitrite, UA: NEGATIVE
Protein Ur, POC: NEGATIVE mg/dL
SPECIFIC GRAVITY, URINE: 1.02
UUROB: NEGATIVE
pH, UA: 6.5 (ref 5.0–8.0)

## 2017-04-02 NOTE — Patient Instructions (Addendum)
You can look on the American Academy of Dermatology website.   Dermatology offices  [facility]: Phone #: [phone] Address: [address] [place], [place] [address]  [facility]: Phone: [phone]  Address: [address], [place], [place] [address]  [facility] Address: [address] [place], [place], [place] [address] Phone: [phone]   Psoriasis Psoriasis is a long-term (chronic) condition of skin inflammation. It occurs because your immune system causes skin cells to form too quickly. As a result, too many skin cells grow and create raised, red patches (plaques) that look silvery on your skin. Plaques may appear anywhere on your body. They can be any size or shape. Psoriasis can come and go. The condition varies from mild to very severe. It cannot be passed from one person to another (not contagious). What are the causes? The cause of psoriasis is not known, but certain factors can make the condition worse. These include:  Damage or trauma to the skin, such as cuts, scrapes, sunburn, and dryness.  Lack of sunlight.  Certain medicines.  Alcohol.  Tobacco use.  Stress.  Infections caused by bacteria or viruses.  What increases the risk? This condition is more likely to develop in:  People with a family history of psoriasis.  People who are Caucasian.  People who are between the ages of 15-58 and 48-28 years old.  What are the signs or symptoms? There are five different types of psoriasis. You can have more than one type of psoriasis during your life. Types are:  Plaque.  Guttate.  Inverse.  Pustular.  Erythrodermic.  Each type of psoriasis has different symptoms.  Plaque psoriasis symptoms include red, raised plaques with a silvery white coating (scale). These plaques may be itchy. Your nails may be pitted and crumbly or fall off.  Guttate psoriasis symptoms include small red spots that often show up on your  trunk, arms, and legs. These spots may develop after you have been sick, especially with strep throat.  Inverse psoriasis symptoms include plaques in your underarm area, under your breasts, or on your genitals, groin, or buttocks.  Pustular psoriasis symptoms include pus-filled bumps that are painful, red, and swollen on the palms of your hands or the soles of your feet. You also may feel exhausted, feverish, weak, or have no appetite.  Erythrodermic psoriasis symptoms include bright red skin that may look burned. You may have a fast heartbeat and a body temperature that is too high or too low. You may be itchy or in pain.  How is this diagnosed? Your health care provider may suspect psoriasis based on your symptoms and family history. Your health care provider will also do a physical exam. This may include a procedure to remove a tissue sample (biopsy) for testing. You may also be referred to a health care provider who specializes in skin diseases (dermatologist). How is this treated? There is no cure for this condition, but treatment can help manage it. Goals of treatment include:  Helping your skin heal.  Reducing itching and inflammation.  Slowing the growth of new skin cells.  Helping your immune system respond better to your skin.  Treatment varies, depending on the severity of your condition. Treatment may include:  Creams or ointments.  Ultraviolet ray exposure (light therapy). This may include natural sunlight or light therapy in a medical office.  Medicines (systemic therapy). These medicines can help your body better manage skin cell turnover and inflammation. They may be used along with light  therapy or ointments. You may also get antibiotic medicines if you have an infection.  Follow these instructions at home: Skin Care  Moisturize your skin as needed. Only use moisturizers that have been approved by your health care provider.  Apply cool compresses to the affected  areas.  Do not scratch your skin. Lifestyle   Do not use tobacco products. This includes cigarettes, chewing tobacco, and e-cigarettes. If you need help quitting, ask your health care provider.  Drink little or no alcohol.  Try techniques for stress reduction, such as meditation or yoga.  Get exposure to the sun as told by your health care provider. Do not get sunburned.  Consider joining a psoriasis support group. Medicines  Take or use over-the-counter and prescription medicines only as told by your health care provider.  If you were prescribed an antibiotic, take or use it as told by your health care provider. Do not stop taking the antibiotic even if your condition starts to improve. General instructions  Keep a journal to help track what triggers an outbreak. Try to avoid any triggers.  See a counselor or social worker if feelings of sadness, frustration, and hopelessness about your condition are interfering with your work and relationships.  Keep all follow-up visits as told by your health care provider. This is important. Contact a health care provider if:  Your pain gets worse.  You have increasing redness or warmth in the affected areas.  You have new or worsening pain or stiffness in your joints.  Your nails start to break easily or pull away from the nail bed.  You have a fever.  You feel depressed. This information is not intended to replace advice given to you by your health care provider. Make sure you discuss any questions you have with your health care provider. Document Released: 01/21/2000 Document Revised: 07/01/2015 Document Reviewed: 06/10/2014 Elsevier Interactive Patient Education  2018 [employer].   Preventative Care for Adults - Female      MAINTAIN REGULAR HEALTH EXAMS:  A routine yearly physical is a good way to check in with your primary care provider about your health and preventive screening. It is also an opportunity to share updates  about your health and any concerns you have, and receive a thorough all-over exam.   Most health insurance companies pay for at least some preventative services.  Check with your health plan for specific coverages.  WHAT PREVENTATIVE SERVICES DO WOMEN NEED?  Adult women should have their weight and blood pressure checked regularly.   Women age 75 and older should have their cholesterol levels checked regularly.  Women should be screened for cervical cancer with a Pap smear and pelvic exam beginning at age 59.   Breast cancer screening generally begins at age 24 with a mammogram and breast exam by your primary care provider.    Beginning at age 59 and continuing to age 94, women should be screened for colorectal cancer.  Certain people may need continued testing until age 74.  Updating vaccinations is part of preventative care.  Vaccinations help protect against diseases such as the flu.  Osteoporosis is a disease in which the bones lose minerals and strength as we age. Women ages 34 and over should discuss this with their caregivers, as should women after menopause who have other risk factors.  Lab tests are generally done as part of preventative care to screen for anemia and blood disorders, to screen for problems with the kidneys and liver, to screen  for bladder problems, to check blood sugar, and to check your cholesterol level.  Preventative services generally include counseling about diet, exercise, avoiding tobacco, drugs, excessive alcohol consumption, and sexually transmitted infections.    GENERAL RECOMMENDATIONS FOR GOOD HEALTH:  Healthy diet:  Eat a variety of foods, including fruit, vegetables, animal or vegetable protein, such as meat, fish, chicken, and eggs, or beans, lentils, tofu, and grains, such as rice.  Drink plenty of water daily.  Decrease saturated fat in the diet, avoid lots of red meat, processed foods, sweets, fast foods, and fried  foods.  Exercise:  Aerobic exercise helps maintain good heart health. At least 30-40 minutes of moderate-intensity exercise is recommended. For example, a brisk walk that increases your heart rate and breathing. This should be done on most days of the week.   Find a type of exercise or a variety of exercises that you enjoy so that it becomes a part of your daily life.  Examples are running, walking, swimming, water aerobics, and biking.  For motivation and support, explore group exercise such as aerobic class, spin class, Zumba, Yoga,or  martial arts, etc.    Set exercise goals for yourself, such as a certain weight goal, walk or run in a race such as a 5k walk/run.  Speak to your primary care provider about exercise goals.  Disease prevention:  If you smoke or chew tobacco, find out from your caregiver how to quit. It can literally save your life, no matter how long you have been a tobacco user. If you do not use tobacco, never begin.   Maintain a healthy diet and normal weight. Increased weight leads to problems with blood pressure and diabetes.   The Body Mass Index or BMI is a way of measuring how much of your body is fat. Having a BMI above 27 increases the risk of heart disease, diabetes, hypertension, stroke and other problems related to obesity. Your caregiver can help determine your BMI and based on it develop an exercise and dietary program to help you achieve or maintain this important measurement at a healthful level.  High blood pressure causes heart and blood vessel problems.  Persistent high blood pressure should be treated with medicine if weight loss and exercise do not work.   Fat and cholesterol leaves deposits in your arteries that can block them. This causes heart disease and vessel disease elsewhere in your body.  If your cholesterol is found to be high, or if you have heart disease or certain other medical conditions, then you may need to have your cholesterol monitored  frequently and be treated with medication.   Ask if you should have a cardiac stress test if your history suggests this. A stress test is a test done on a treadmill that looks for heart disease. This test can find disease prior to there being a problem.  Menopause can be associated with physical symptoms and risks. Hormone replacement therapy is available to decrease these. You should talk to your caregiver about whether starting or continuing to take hormones is right for you.   Osteoporosis is a disease in which the bones lose minerals and strength as we age. This can result in serious bone fractures. Risk of osteoporosis can be identified using a bone density scan. Women ages 2365 and over should discuss this with their caregivers, as should women after menopause who have other risk factors. Ask your caregiver whether you should be taking a calcium supplement and Vitamin D, to reduce  the rate of osteoporosis.   Avoid drinking alcohol in excess (more than two drinks per day).  Avoid use of street drugs. Do not share needles with anyone. Ask for professional help if you need assistance or instructions on stopping the use of alcohol, cigarettes, and/or drugs.  Brush your teeth twice a day with fluoride toothpaste, and floss once a day. Good oral hygiene prevents tooth decay and gum disease. The problems can be painful, unattractive, and can cause other health problems. Visit your dentist for a routine oral and dental check up and preventive care every 6-12 months.   Look at your skin regularly.  Use a mirror to look at your back. Notify your caregivers of changes in moles, especially if there are changes in shapes, colors, a size larger than a pencil eraser, an irregular border, or development of new moles.  Safety:  Use seatbelts 100% of the time, whether driving or as a passenger.  Use safety devices such as hearing protection if you work in environments with loud noise or significant background  noise.  Use safety glasses when doing any work that could send debris in to the eyes.  Use a helmet if you ride a bike or motorcycle.  Use appropriate safety gear for contact sports.  Talk to your caregiver about gun safety.  Use sunscreen with a SPF (or skin protection factor) of 15 or greater.  Lighter skinned people are at a greater risk of skin cancer. Don't forget to also wear sunglasses in order to protect your eyes from too much damaging sunlight. Damaging sunlight can accelerate cataract formation.   Practice safe sex. Use condoms. Condoms are used for birth control and to help reduce the spread of sexually transmitted infections (or STIs).  Some of the STIs are gonorrhea (the clap), chlamydia, syphilis, trichomonas, herpes, HPV (human papilloma virus) and HIV (human immunodeficiency virus) which causes AIDS. The herpes, HIV and HPV are viral illnesses that have no cure. These can result in disability, cancer and death.   Keep carbon monoxide and smoke detectors in your home functioning at all times. Change the batteries every 6 months or use a model that plugs into the wall.   Vaccinations:  Stay up to date with your tetanus shots and other required immunizations. You should have a booster for tetanus every 10 years. Be sure to get your flu shot every year, since 5%-20% of the [place] population comes down with the flu. The flu vaccine changes each year, so being vaccinated once is not enough. Get your shot in the fall, before the flu season peaks.   Other vaccines to consider:  Human Papilloma Virus or HPV causes cancer of the cervix, and other infections that can be transmitted from person to person. There is a vaccine for HPV, and females should get immunized between the ages of 71 and 66. It requires a series of 3 shots.   Pneumococcal vaccine to protect against certain types of pneumonia.  This is normally recommended for adults age 38 or older.  However, adults younger than 29 years old  with certain underlying conditions such as diabetes, heart or lung disease should also receive the vaccine.  Shingles vaccine to protect against Varicella Zoster if you are older than age 62, or younger than 29 years old with certain underlying illness.  Hepatitis A vaccine to protect against a form of infection of the liver by a virus acquired from food.  Hepatitis B vaccine to protect against a form of  infection of the liver by a virus acquired from blood or body fluids, particularly if you work in health care.  If you plan to travel internationally, check with your local health department for specific vaccination recommendations.  Cancer Screening:  Breast cancer screening is essential to preventive care for women. All women age 69 and older should perform a breast self-exam every month. At age 61 and older, women should have their caregiver complete a breast exam each year. Women at ages 55 and older should have a mammogram (x-ray film) of the breasts. Your caregiver can discuss how often you need mammograms.    Cervical cancer screening includes taking a Pap smear (sample of cells examined under a microscope) from the cervix (end of the uterus). It also includes testing for HPV (Human Papilloma Virus, which can cause cervical cancer). Screening and a pelvic exam should begin at age 90, or 3 years after a woman becomes sexually active. Screening should occur every year, with a Pap smear but no HPV testing, up to age 98. After age 13, you should have a Pap smear every 3 years with HPV testing, if no HPV was found previously.   Most routine colon cancer screening begins at the age of 4. On a yearly basis, doctors may provide special easy to use take-home tests to check for hidden blood in the stool. Sigmoidoscopy or colonoscopy can detect the earliest forms of colon cancer and is life saving. These tests use a small camera at the end of a tube to directly examine the colon. Speak to your caregiver  about this at age 86, when routine screening begins (and is repeated every 5 years unless early forms of pre-cancerous polyps or small growths are found).

## 2017-04-03 LAB — CBC WITH DIFFERENTIAL/PLATELET
Basophils Absolute: 0 10*3/uL (ref 0.0–0.2)
Basos: 1 %
EOS (ABSOLUTE): 0 10*3/uL (ref 0.0–0.4)
EOS: 0 %
HEMATOCRIT: 40.5 % (ref 34.0–46.6)
HEMOGLOBIN: 13.4 g/dL (ref 11.1–15.9)
IMMATURE GRANULOCYTES: 0 %
Immature Grans (Abs): 0 10*3/uL (ref 0.0–0.1)
Lymphocytes Absolute: 1.8 10*3/uL (ref 0.7–3.1)
Lymphs: 29 %
MCH: 29.2 pg (ref 26.6–33.0)
MCHC: 33.1 g/dL (ref 31.5–35.7)
MCV: 88 fL (ref 79–97)
MONOCYTES: 7 %
Monocytes Absolute: 0.4 10*3/uL (ref 0.1–0.9)
NEUTROS PCT: 63 %
Neutrophils Absolute: 3.9 10*3/uL (ref 1.4–7.0)
Platelets: 280 10*3/uL (ref 150–379)
RBC: 4.59 x10E6/uL (ref 3.77–5.28)
RDW: 13.6 % (ref 12.3–15.4)
WBC: 6.1 10*3/uL (ref 3.4–10.8)

## 2017-04-03 LAB — COMPREHENSIVE METABOLIC PANEL
ALBUMIN: 4.2 g/dL (ref 3.5–5.5)
ALT: 9 IU/L (ref 0–32)
AST: 10 IU/L (ref 0–40)
Albumin/Globulin Ratio: 1.6 (ref 1.2–2.2)
Alkaline Phosphatase: 22 IU/L — ABNORMAL LOW (ref 39–117)
BUN / CREAT RATIO: 11 (ref 9–23)
BUN: 8 mg/dL (ref 6–20)
Bilirubin Total: 0.5 mg/dL (ref 0.0–1.2)
CALCIUM: 8.9 mg/dL (ref 8.7–10.2)
CO2: 18 mmol/L — AB (ref 20–29)
CREATININE: 0.71 mg/dL (ref 0.57–1.00)
Chloride: 104 mmol/L (ref 96–106)
GFR calc Af Amer: 133 mL/min/{1.73_m2} (ref >59)
GFR, EST NON AFRICAN AMERICAN: 115 mL/min/{1.73_m2} (ref >59)
GLOBULIN, TOTAL: 2.7 g/dL (ref 1.5–4.5)
Glucose: 89 mg/dL (ref 65–99)
Potassium: 4.1 mmol/L (ref 3.5–5.2)
SODIUM: 135 mmol/L (ref 134–144)
Total Protein: 6.9 g/dL (ref 6.0–8.5)

## 2017-04-03 LAB — LIPID PANEL
Chol/HDL Ratio: 4 ratio (ref 0.0–4.4)
Cholesterol, Total: 178 mg/dL (ref 100–199)
HDL: 45 mg/dL (ref >39)
LDL Calculated: 106 mg/dL — ABNORMAL HIGH (ref 0–99)
TRIGLYCERIDES: 133 mg/dL (ref 0–149)
VLDL CHOLESTEROL CAL: 27 mg/dL (ref 5–40)

## 2017-04-03 LAB — TSH: TSH: 1.27 u[IU]/mL (ref 0.450–4.500)

## 2017-04-11 ENCOUNTER — Other Ambulatory Visit: Payer: Self-pay | Admitting: Family Medicine

## 2017-04-12 ENCOUNTER — Encounter: Payer: Self-pay | Admitting: Family Medicine

## 2017-04-13 ENCOUNTER — Telehealth: Payer: Self-pay | Admitting: Family Medicine

## 2017-04-13 ENCOUNTER — Encounter: Payer: Self-pay | Admitting: Family Medicine

## 2017-04-13 NOTE — Telephone Encounter (Signed)
Received requested pap from [facility]. Sending back for review.  °

## 2017-05-21 ENCOUNTER — Telehealth: Payer: Self-pay

## 2017-05-21 NOTE — Telephone Encounter (Signed)
As far as I know, HPV does not prevent her from giving blood.

## 2017-05-21 NOTE — Telephone Encounter (Signed)
Patient wants to know if she can give blood if has has HPV virus. Please advise.

## 2017-05-21 NOTE — Telephone Encounter (Signed)
Patient notified

## 2017-08-17 ENCOUNTER — Encounter: Payer: Self-pay | Admitting: Family Medicine

## 2017-08-17 ENCOUNTER — Ambulatory Visit: Payer: BC Managed Care – PPO | Admitting: Family Medicine

## 2017-08-17 VITALS — BP 120/80 | HR 86 | Temp 98.4°F | Wt 182.8 lb

## 2017-08-17 DIAGNOSIS — R21 Rash and other nonspecific skin eruption: Secondary | ICD-10-CM

## 2017-08-17 MED ORDER — TRIAMCINOLONE ACETONIDE 0.1 % EX CREA: 1.0000 "application " | TOPICAL_CREAM | Freq: Two times a day (BID) | CUTANEOUS | 0 refills | Status: DC

## 2017-08-17 NOTE — Progress Notes (Signed)
   Subjective:    Patient ID: Daisy Singleton, female    DOB: 08/05/1990, 27 y.o.   MRN: [id number]  HPI Chief Complaint  Patient presents with  . red bump    gave blood and bump came up and sometimes its itchy. its been there like 3 weeks.    She is here with complaints of a 3 week history of a red raised pruritic rash on her right anterior antecubital area. No known exposure.  She had blood drawn in that area but does not think it was specifically over the rash.   Denies fever, chills, N/V/D.    Review of Systems Pertinent positives and negatives in the history of present illness.     Objective:   Physical Exam BP 120/80   Pulse 86   Temp 98.4 F (36.9 C) (Oral)   Wt 182 lb 12.8 oz (82.9 kg)   BMI 34.54 kg/m   Small, round, red erythematous pruritic rash on her right anterior antecubital area. No induration, fluctuance or surrounding erythema. No satellite lesions.       Assessment & Plan:  Rash and nonspecific skin eruption - Plan: triamcinolone cream (KENALOG) 0.1 %  Unclear etiology. She will try Kenalog for one week and let me know if the area is not improving or worsening.

## 2017-10-16 ENCOUNTER — Encounter: Payer: Self-pay | Admitting: Family Medicine

## 2017-10-16 ENCOUNTER — Ambulatory Visit: Payer: BC Managed Care – PPO | Admitting: Family Medicine

## 2017-10-16 VITALS — BP 108/74 | HR 84 | Temp 98.1°F | Wt 178.4 lb

## 2017-10-16 DIAGNOSIS — J069 Acute upper respiratory infection, unspecified: Secondary | ICD-10-CM

## 2017-10-16 DIAGNOSIS — B9789 Other viral agents as the cause of diseases classified elsewhere: Secondary | ICD-10-CM

## 2017-10-16 DIAGNOSIS — J029 Acute pharyngitis, unspecified: Secondary | ICD-10-CM

## 2017-10-16 LAB — POCT RAPID STREP A (OFFICE): Rapid Strep A Screen: NEGATIVE

## 2017-10-16 NOTE — Progress Notes (Signed)
   Subjective:    Patient ID: Daisy Singleton, female    DOB: 02-17-1990, 27 y.o.   MRN: [id number]  HPI She has a 3-day history of started with headache, sore throat, sinus congestion, slight cough with sneezing, ear congestion, PND and slight nausea.  She has been taking Tylenol Cold and sinus to help with this.   Review of Systems     Objective:   Physical Exam Alert and in no distress. Tympanic membranes and canals are normal. Pharyngeal area is normal. Neck is supple without adenopathy or thyromegaly. Cardiac exam shows a regular sinus rhythm without murmurs or gallops. Lungs are clear to auscultation. Strep screen is negative       Assessment & Plan:  Sore throat - Plan: POCT rapid strep A  Viral URI with cough Rec recommend supportive care including NyQuil at night per

## 2018-04-10 ENCOUNTER — Ambulatory Visit: Payer: BC Managed Care – PPO | Admitting: Psychiatry

## 2018-04-10 ENCOUNTER — Encounter: Payer: Self-pay | Admitting: Psychiatry

## 2018-04-10 VITALS — BP 116/74 | HR 72 | Ht 61.0 in | Wt 171.0 lb

## 2018-04-10 DIAGNOSIS — F410 Panic disorder [episodic paroxysmal anxiety] without agoraphobia: Secondary | ICD-10-CM | POA: Diagnosis not present

## 2018-04-10 DIAGNOSIS — F411 Generalized anxiety disorder: Secondary | ICD-10-CM | POA: Diagnosis not present

## 2018-04-10 MED ORDER — LORAZEPAM 0.5 MG PO TABS: 0.5000 mg | ORAL_TABLET | Freq: Three times a day (TID) | ORAL | 2 refills | Status: DC | PRN

## 2018-04-10 NOTE — Progress Notes (Signed)
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  The patient discontinued her Zoloft 2 months before her last appointment March 25 at which time she started gabapentin to stabilize sleep and anxiety sufficiently to not have to restart Zoloft.  She stopped the newly started gabapentin several months after last appointment but has continued as needed lorazepam infrequently until lately when anxiety is increasing again.  She requires the entire session to clarify stressors in a hierarchy use for understanding and  coping, the greatest of which has been husband's cardiac arrhythmia resulting in measurement of cardiac ejection fraction in the heart failure range without other defined reason.  During the course of this she has lost 20 pounds by diet and yoga exercise, though she does not necessarily associate weight reduction from obesity with husband's heart problems.  She has been dog sitting for friend and has a Archivist living in renting a room which has extended beyond that agreed-upon, leaving extra work and less capacity to take time off, now looking for a new job having a new job as of last appointment.  Anxiety attacks have increased needing more lorazepam but having a small supply remaining.  She seeks more lorazepam but is hesitant about other medication management.  She has no mania, depression, self-harm, psychosis, substance use, or dissociation.  Anxiety  Presents for follow-up visit. Symptoms include excessive worry, hyperventilation, insomnia, nervous/anxious behavior, panic and shortness of  breath. Patient reports no confusion, decreased concentration, depressed mood, malaise, muscle tension, nausea, palpitations or suicidal ideas. Symptoms occur most days. The severity of symptoms is interfering with daily activities and moderate. The quality of sleep is fair. Nighttime awakenings: occasional.   Compliance with medications is 51-75%.    Individual Medical History/ Review of Systems: Changes? :Yes Normal general medical exam last March 2019 except weight, and she continues to have Nexplanon.  She has no substance use.  Allergies: Latex  Current Medications:  Current Outpatient Medications:  .  clobetasol (OLUX) 0.05 % topical foam, Apply 1 Dose topically daily. , Disp: , Rfl: 0 .  Etonogestrel (NEXPLANON [place]), Inject into the skin., Disp: , Rfl:  .  LORazepam (ATIVAN) 0.5 MG tablet, Take 1 tablet (0.5 mg total) by mouth 3 (three) times daily as needed for anxiety (Panic)., Disp: 20 tablet, Rfl: 2 .  PREVIDENT 5000 BOOSTER PLUS 1.1 % PSTE, toothpaste, Disp: , Rfl: 0 .  Pseudoeph-CPM-DM-APAP (TYLENOL COLD CONVENIENCE PACK PO), Take 2 capsules by mouth., Disp: , Rfl:  .  triamcinolone cream (KENALOG) 0.1 %, Apply 1 application topically 2 (two) times daily., Disp: 30 g, Rfl: 0   Medication Side Effects: none  Family Medical/ Social History: Changes? Yes as of last appointment, she was considering conceiving turning age 28 years through with school unless seeking PhD.  She concludes the session today realizing she may need to resume psychotherapy for all these stressors and decisions..  MENTAL HEALTH EXAM: Muscle strengths and tone 5/5, postural reflexes and gait 0/0, and AIMS = 0. Blood pressure 116/74, pulse 72, height 5\' 1"  (1.549 m), weight 171 lb (77.6 kg).Body mass index is 32.31 kg/m.  General Appearance: Casual, Fairly Groomed, Guarded and Obese  Eye Contact:  Fair  Speech:  Blocked, Clear and Coherent and Normal Rate  Volume:  Normal  Mood:  Anxious, Dysphoric,  Euthymic, Irritable and Worthless  Affect:  Inappropriate, Labile and Anxious  Thought Process:  Goal Directed, Irrelevant and Linear  Orientation:  Full (Time, Place, and Person)  Thought Content: Obsessions and Rumination   Suicidal Thoughts:  No  Homicidal Thoughts:  No  Memory:  Immediate;   Good Remote;   Good  Judgement:  Fair  Insight:  Fair  Psychomotor Activity:  Normal and Mannerisms  Concentration:  Concentration: Fair and Attention Span: Good  Recall:  Good  Fund of Knowledge: Good  Language: Good  Assets:  Desire for Improvement Leisure Time Resilience  ADL's:  Intact  Cognition: WNL  Prognosis:  Good    DIAGNOSES:    ICD-10-CM   1. Generalized anxiety disorder F41.1   2. Panic disorder F41.0     Receiving Psychotherapy: No    RECOMMENDATIONS: Greater than 50% of the session is spent in counseling and coordination of care addressing therapy options including in this office, especially processing CBT to complement her diet and yoga exercises.  Exposure desensitization habit reversal thought stopping response prevention for sleep hygiene, behavioral nutrition, social skills and problem solving interventions educate warnings and risk of diagnoses and treatment including medication for prevention and monitoring and crisis plans if needed.  Luvox or Cymbalta will likely be best if she becomes willing to add to the plan for lorazepam 0.5 mg 3 times daily if needed for panic and generalized anxiety #20 with 2 refills sent to [facility] [place] patient declining larger supply as current supply is likely out of date and no longer effective so that she prefers smaller bottles with refills as provided.  She decline to schedule return appointment again leaving that open when needed.   Chauncey Mann, MD

## 2018-08-08 LAB — HM PAP SMEAR: HM Pap smear: NEGATIVE

## 2018-11-12 ENCOUNTER — Other Ambulatory Visit: Payer: Self-pay

## 2018-11-12 DIAGNOSIS — Z20822 Contact with and (suspected) exposure to covid-19: Secondary | ICD-10-CM

## 2018-11-14 LAB — NOVEL CORONAVIRUS, NAA: SARS-CoV-2, NAA: NOT DETECTED

## 2018-11-14 LAB — SPECIMEN STATUS REPORT

## 2018-12-04 ENCOUNTER — Encounter: Payer: Self-pay | Admitting: Family Medicine

## 2018-12-04 ENCOUNTER — Other Ambulatory Visit: Payer: Self-pay

## 2018-12-04 ENCOUNTER — Ambulatory Visit (INDEPENDENT_AMBULATORY_CARE_PROVIDER_SITE_OTHER): Payer: BC Managed Care – PPO | Admitting: Family Medicine

## 2018-12-04 VITALS — BP 120/80 | HR 75 | Temp 98.4°F | Ht 61.0 in | Wt 165.2 lb

## 2018-12-04 DIAGNOSIS — Z0000 Encounter for general adult medical examination without abnormal findings: Secondary | ICD-10-CM

## 2018-12-04 DIAGNOSIS — Z23 Encounter for immunization: Secondary | ICD-10-CM

## 2018-12-04 DIAGNOSIS — Z131 Encounter for screening for diabetes mellitus: Secondary | ICD-10-CM

## 2018-12-04 DIAGNOSIS — E669 Obesity, unspecified: Secondary | ICD-10-CM | POA: Diagnosis not present

## 2018-12-04 NOTE — Patient Instructions (Signed)
Preventive Care 21-30 Years Old, Female Preventive care refers to visits with your health care provider and lifestyle choices that can promote health and wellness. This includes:  A yearly physical exam. This may also be called an annual well check.  Regular dental visits and eye exams.  Immunizations.  Screening for certain conditions.  Healthy lifestyle choices, such as eating a healthy diet, getting regular exercise, not using drugs or products that contain nicotine and tobacco, and limiting alcohol use. What can I expect for my preventive care visit? Physical exam Your health care provider will check your:  Height and weight. This may be used to calculate body mass index (BMI), which tells if you are at a healthy weight.  Heart rate and blood pressure.  Skin for abnormal spots. Counseling Your health care provider may ask you questions about your:  Alcohol, tobacco, and drug use.  Emotional well-being.  Home and relationship well-being.  Sexual activity.  Eating habits.  Work and work environment.  Method of birth control.  Menstrual cycle.  Pregnancy history. What immunizations do I need?  Influenza (flu) vaccine  This is recommended every year. Tetanus, diphtheria, and pertussis (Tdap) vaccine  You may need a Td booster every 10 years. Varicella (chickenpox) vaccine  You may need this if you have not been vaccinated. Human papillomavirus (HPV) vaccine  If recommended by your health care provider, you may need three doses over 6 months. Measles, mumps, and rubella (MMR) vaccine  You may need at least one dose of MMR. You may also need a second dose. Meningococcal conjugate (MenACWY) vaccine  One dose is recommended if you are age 19-21 years and a first-year college student living in a residence hall, or if you have one of several medical conditions. You may also need additional booster doses. Pneumococcal conjugate (PCV13) vaccine  You may need  this if you have certain conditions and were not previously vaccinated. Pneumococcal polysaccharide (PPSV23) vaccine  You may need one or two doses if you smoke cigarettes or if you have certain conditions. Hepatitis A vaccine  You may need this if you have certain conditions or if you travel or work in places where you may be exposed to hepatitis A. Hepatitis B vaccine  You may need this if you have certain conditions or if you travel or work in places where you may be exposed to hepatitis B. Haemophilus influenzae type b (Hib) vaccine  You may need this if you have certain conditions. You may receive vaccines as individual doses or as more than one vaccine together in one shot (combination vaccines). Talk with your health care provider about the risks and benefits of combination vaccines. What tests do I need?  Blood tests  Lipid and cholesterol levels. These may be checked every 5 years starting at age 20.  Hepatitis C test.  Hepatitis B test. Screening  Diabetes screening. This is done by checking your blood sugar (glucose) after you have not eaten for a while (fasting).  Sexually transmitted disease (STD) testing.  BRCA-related cancer screening. This may be done if you have a family history of breast, ovarian, tubal, or peritoneal cancers.  Pelvic exam and Pap test. This may be done every 3 years starting at age 21. Starting at age 30, this may be done every 5 years if you have a Pap test in combination with an HPV test. Talk with your health care provider about your test results, treatment options, and if necessary, the need for more tests.   Follow these instructions at home: Eating and drinking   Eat a diet that includes fresh fruits and vegetables, whole grains, lean protein, and low-fat dairy.  Take vitamin and mineral supplements as recommended by your health care provider.  Do not drink alcohol if: ? Your health care provider tells you not to drink. ? You are  pregnant, may be pregnant, or are planning to become pregnant.  If you drink alcohol: ? Limit how much you have to 0-1 drink a day. ? Be aware of how much alcohol is in your drink. In the [place] one drink equals one 12 oz bottle of beer (355 mL), one 5 oz glass of wine (148 mL), or one 1 oz glass of hard liquor (44 mL). Lifestyle  Take daily care of your teeth and gums.  Stay active. Exercise for at least 30 minutes on 5 or more days each week.  Do not use any products that contain nicotine or tobacco, such as cigarettes, e-cigarettes, and chewing tobacco. If you need help quitting, ask your health care provider.  If you are sexually active, practice safe sex. Use a condom or other form of birth control (contraception) in order to prevent pregnancy and STIs (sexually transmitted infections). If you plan to become pregnant, see your health care provider for a preconception visit. What's next?  Visit your health care provider once a year for a well check visit.  Ask your health care provider how often you should have your eyes and teeth checked.  Stay up to date on all vaccines. This information is not intended to replace advice given to you by your health care provider. Make sure you discuss any questions you have with your health care provider. Document Released: 03/21/2001 Document Revised: 10/04/2017 Document Reviewed: 10/04/2017 Elsevier Patient Education  2020 Elsevier Inc.  

## 2018-12-04 NOTE — Progress Notes (Signed)
Subjective:    Patient ID: Daisy Singleton, female    DOB: [birth year], 28 y.o.   MRN: [id number]  HPI Chief Complaint  Patient presents with  . fasting cpe    fasting cpe, wants flu and tetanus shot, sees obgyn   She is here for a complete physical exam. Previous medical care: Last CPE: 03/2017  Other providers: Dr. Matthew Saras - [facility] - for abdominal pain   Hx of chronic abdominal pain. States she saw a urologist and was told she should have testing for IC and instead she has been doing the diet for interstitial cystitis   Anxiety- getting counseling   States she has lost a significant amount of weight, intentionally. States she saw a nutritionist.   Social history: Lives with husband, works as Engineer, maintenance support at [employer]. Denies smoking, drug use. Has cut back on alcohol and has 1 drink per week on average.  Diet: healthy diet. No alcohol or carbonated drinks. Also stopped vinegar  Excerise: "not since Covid-19". Lack of motivation after yoga studio closed  Immunizations: flu and Tdap today   Health maintenance:  Mammogram: N/A Colonoscopy: had one 2 years ago at [facility].  Last Gynecological Exam: in the past 6 months  Last Menstrual cycle: having periods but not regular. Nexplanon  Last Dental Exam: summer 2020 Last Eye Exam: May 2020   Wears seatbelt always, uses sunscreen, smoke detectors in home and functioning, does not text while driving and feels safe in home environment.   Reviewed allergies, medications, past medical, surgical, family, and social history.     Review of Systems Review of Systems Constitutional: -fever, -chills, -sweats, -unexpected weight change,-fatigue ENT: -runny nose, -ear pain, -sore throat Cardiology:  -chest pain, -palpitations, -edema Respiratory: -cough, -shortness of breath, -wheezing Gastroenterology: -abdominal pain, -nausea, -vomiting, -diarrhea, -constipation  Hematology: -bleeding or bruising problems  Musculoskeletal: -arthralgias, -myalgias, -joint swelling, -back pain Ophthalmology: -vision changes Urology: -dysuria, -difficulty urinating, -hematuria, -urinary frequency, -urgency Neurology: -headache, -weakness, -tingling, -numbness       Objective:   Physical Exam BP 120/80   Pulse 75   Temp 98.4 F (36.9 C)   Ht 5\' 1"  (1.549 m)   Wt 165 lb 3.2 oz (74.9 kg)   BMI 31.21 kg/m   General Appearance:    Alert, cooperative, no distress, appears stated age  Head:    Normocephalic, without obvious abnormality, atraumatic  Eyes:    PERRL, conjunctiva/corneas clear, EOM's intact, fundi    benign  Ears:    Normal TM's and external ear canals  Nose:   Mask   Throat:   Mask  Neck:   Supple, no lymphadenopathy;  thyroid:  no   enlargement/tenderness/nodules; no carotid   bruit or JVD  Back:    Spine nontender, no curvature, ROM normal, no CVA     tenderness  Lungs:     Clear to auscultation bilaterally without wheezes, rales or     ronchi; respirations unlabored  Chest Wall:    No tenderness or deformity   Heart:    Regular rate and rhythm, S1 and S2 normal, no murmur, rub   or gallop  Breast Exam:    OB /GYN  Abdomen:     Soft, non-tender, nondistended, normoactive bowel sounds,    no masses, no hepatosplenomegaly  Genitalia:    OB /GYN  Rectal:    Not performed due to age<40 and no related complaints  Extremities:   No clubbing, cyanosis or edema  Pulses:  2+ and symmetric all extremities  Skin:   Skin color, texture, turgor normal, no rashes or lesions  Lymph nodes:   Cervical, supraclavicular, and axillary nodes normal  Neurologic:   CNII-XII intact, normal strength, sensation and gait; reflexes 2+ and symmetric throughout          Psych:   Normal mood, affect, hygiene and grooming.        Assessment & Plan:  Routine general medical examination at a health care facility - Plan: CBC with Differential/Platelet, Comprehensive metabolic panel, TSH, T4, free, Lipid panel  -Here today for a fasting CPE.  She requests labs. Discussed preventive healthcare and she is followed by her OB/GYN regularly.  Immunizations reviewed and updated.  Discussed safety. Counseling on healthy lifestyle  Obesity (BMI 30.0-34.9) - Plan: TSH, T4, free, Lipid panel, Hemoglobin A1c -She has lost a significant amount of weight in the past and questions what her goal BMI should be.  Discussed setting a 10 pound weight loss goal and eating a healthy diet as well as increasing physical activity.  Discussed using a free app such as my fitness pal.  She has seen a nutritionist in the past and is aware of how to eat healthy  Screening for diabetes mellitus - Plan: Hemoglobin A1c  Need for diphtheria-tetanus-pertussis (Tdap) vaccine - Plan: Tdap vaccine greater than or equal to 7yo IM-counseling done on all components of vaccine  Needs flu shot - Plan: Flu Vaccine QUAD 36+ mos IM

## 2018-12-05 ENCOUNTER — Telehealth: Payer: Self-pay | Admitting: Internal Medicine

## 2018-12-05 LAB — COMPREHENSIVE METABOLIC PANEL
ALT: 11 IU/L (ref 0–32)
AST: 14 IU/L (ref 0–40)
Albumin/Globulin Ratio: 1.5 (ref 1.2–2.2)
Albumin: 4.3 g/dL (ref 3.9–5.0)
Alkaline Phosphatase: 23 IU/L — ABNORMAL LOW (ref 39–117)
BUN/Creatinine Ratio: 10 (ref 9–23)
BUN: 7 mg/dL (ref 6–20)
Bilirubin Total: 0.6 mg/dL (ref 0.0–1.2)
CO2: 20 mmol/L (ref 20–29)
Calcium: 8.9 mg/dL (ref 8.7–10.2)
Chloride: 105 mmol/L (ref 96–106)
Creatinine, Ser: 0.68 mg/dL (ref 0.57–1.00)
GFR calc Af Amer: 136 mL/min/{1.73_m2} (ref >59)
GFR calc non Af Amer: 118 mL/min/{1.73_m2} (ref >59)
Globulin, Total: 2.9 g/dL (ref 1.5–4.5)
Glucose: 89 mg/dL (ref 65–99)
Potassium: 3.8 mmol/L (ref 3.5–5.2)
Sodium: 136 mmol/L (ref 134–144)
Total Protein: 7.2 g/dL (ref 6.0–8.5)

## 2018-12-05 LAB — LIPID PANEL
Chol/HDL Ratio: 5 ratio — ABNORMAL HIGH (ref 0.0–4.4)
Cholesterol, Total: 249 mg/dL — ABNORMAL HIGH (ref 100–199)
HDL: 50 mg/dL (ref >39)
LDL Chol Calc (NIH): 178 mg/dL — ABNORMAL HIGH (ref 0–99)
Triglycerides: 116 mg/dL (ref 0–149)
VLDL Cholesterol Cal: 21 mg/dL (ref 5–40)

## 2018-12-05 LAB — CBC WITH DIFFERENTIAL/PLATELET
Basophils Absolute: 0.1 10*3/uL (ref 0.0–0.2)
Basos: 1 %
EOS (ABSOLUTE): 0 10*3/uL (ref 0.0–0.4)
Eos: 0 %
Hematocrit: 39 % (ref 34.0–46.6)
Hemoglobin: 13.1 g/dL (ref 11.1–15.9)
Immature Grans (Abs): 0 10*3/uL (ref 0.0–0.1)
Immature Granulocytes: 0 %
Lymphocytes Absolute: 2.3 10*3/uL (ref 0.7–3.1)
Lymphs: 37 %
MCH: 29.4 pg (ref 26.6–33.0)
MCHC: 33.6 g/dL (ref 31.5–35.7)
MCV: 88 fL (ref 79–97)
Monocytes Absolute: 0.4 10*3/uL (ref 0.1–0.9)
Monocytes: 6 %
Neutrophils Absolute: 3.5 10*3/uL (ref 1.4–7.0)
Neutrophils: 56 %
Platelets: 229 10*3/uL (ref 150–450)
RBC: 4.45 x10E6/uL (ref 3.77–5.28)
RDW: 12.8 % (ref 11.7–15.4)
WBC: 6.2 10*3/uL (ref 3.4–10.8)

## 2018-12-05 LAB — HEMOGLOBIN A1C
Est. average glucose Bld gHb Est-mCnc: 100 mg/dL
Hgb A1c MFr Bld: 5.1 % (ref 4.8–5.6)

## 2018-12-05 LAB — TSH: TSH: 1.19 u[IU]/mL (ref 0.450–4.500)

## 2018-12-05 LAB — T4, FREE: Free T4: 1.16 ng/dL (ref 0.82–1.77)

## 2018-12-05 NOTE — Telephone Encounter (Signed)
Pt called and states that she had 2 shots in her arm due to her BC in her left arm and wanted both in her right arm. She said her arm is red and swollen and warmth to touch. I advised her that Tdap can make a local reaction sometimes and she can use tylenol or cool compresses on the area to see if it goes down. Pt would try that for the next couple day and if got worse she would call the on call doctor to advise what's next.

## 2018-12-11 ENCOUNTER — Other Ambulatory Visit: Payer: Self-pay | Admitting: Psychiatry

## 2018-12-11 NOTE — Telephone Encounter (Signed)
Last apt 03/04 nothing scheduled

## 2018-12-11 NOTE — Telephone Encounter (Signed)
Last fill per [place] registry 07/20/2018 and last appointment was 04/10/2018 now 2 months over mandated 79-month follow-up required for controlled substance ending emergency supply for not dosing abrupt withdrawal so the patient can taper off if not returning for follow-up or make appointment to be seen, but no further refills of lorazepam without appt.

## 2018-12-19 ENCOUNTER — Other Ambulatory Visit: Payer: Self-pay

## 2018-12-19 ENCOUNTER — Encounter: Payer: Self-pay | Admitting: Psychiatry

## 2018-12-19 ENCOUNTER — Ambulatory Visit (INDEPENDENT_AMBULATORY_CARE_PROVIDER_SITE_OTHER): Payer: BC Managed Care – PPO | Admitting: Psychiatry

## 2018-12-19 VITALS — Ht 61.0 in | Wt 167.0 lb

## 2018-12-19 DIAGNOSIS — F411 Generalized anxiety disorder: Secondary | ICD-10-CM

## 2018-12-19 DIAGNOSIS — F410 Panic disorder [episodic paroxysmal anxiety] without agoraphobia: Secondary | ICD-10-CM | POA: Diagnosis not present

## 2018-12-19 MED ORDER — LORAZEPAM 0.5 MG PO TABS: 0.5000 mg | ORAL_TABLET | Freq: Two times a day (BID) | ORAL | 2 refills | Status: DC

## 2018-12-19 MED ORDER — LORAZEPAM 0.5 MG PO TABS: 0.7500 mg | ORAL_TABLET | Freq: Two times a day (BID) | ORAL | 2 refills | Status: DC

## 2018-12-19 MED ORDER — DULOXETINE HCL 20 MG PO CPEP: 20.0000 mg | ORAL_CAPSULE | Freq: Every day | ORAL | 5 refills | Status: DC
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
## 2019-01-06 ENCOUNTER — Ambulatory Visit: Payer: BC Managed Care – PPO | Admitting: Family Medicine

## 2019-01-06 ENCOUNTER — Other Ambulatory Visit: Payer: Self-pay

## 2019-01-06 ENCOUNTER — Encounter: Payer: Self-pay | Admitting: Family Medicine

## 2019-01-06 VITALS — HR 75 | Wt 163.0 lb

## 2019-01-06 DIAGNOSIS — E7800 Pure hypercholesterolemia, unspecified: Secondary | ICD-10-CM | POA: Diagnosis not present

## 2019-01-06 DIAGNOSIS — H6692 Otitis media, unspecified, left ear: Secondary | ICD-10-CM | POA: Diagnosis not present

## 2019-01-06 MED ORDER — AMOXICILLIN 875 MG PO TABS: 875.0000 mg | ORAL_TABLET | Freq: Two times a day (BID) | ORAL | 0 refills | Status: DC

## 2019-01-06 NOTE — Progress Notes (Signed)
   Subjective:  Documentation for virtual audio and video telecommunications through [place] encounter:  The patient was located at work at [employer] 2 patient identifiers used.  The provider was located in the office. The patient did consent to this visit and is aware of possible charges through their insurance for this visit.  The other persons participating in this telemedicine service were none.    Patient ID: Daisy Singleton, female    DOB: 06/28/1990, 28 y.o.   MRN: [id number]  HPI Chief Complaint  Patient presents with  . ear pain    ear pain x started last tuesday night. left ear.    Complains of left ear pain x 4 days, this was preceded by a "bubbling noise". The noise is intermittent. States she has occasional drainage of clear fluid. States she has some allergy symptoms and this is controlled with nondrowsy antihistamine  States she had a low grade fever over the weekend.  Hx of ear infection in college and felt similar.   She took Sudafed congestion and this did not help. Used ear drops homeopathic over the weekend and Zyrtec.   She has underlying allergies.   Denies chills, fatigue, body aches, sore throat, cough, chest pain, palpitations, shortness of breath, abdominal pain, nausea, vomiting, diarrhea.  No loss of sense of smell or taste.   No known exposure of COVID-19.  Reviewed allergies, medications, past medical, surgical, family, and social history.  She is also concerned regarding her cholesterol.  Her LDL was 178 in October 2020.  This was a significant increase from the previous year.  States she is concerned because she has been eating a healthy low-fat diet in her opinion and has been exercising.  She does have a nutritionist and plans to talk to that person as well.  She would like to have this rechecked in 6 months.  States her parents both have issues with cholesterol.    Review of Systems Pertinent positives and negatives in the history of  present illness.     Objective:   Physical Exam Pulse 75   Wt 163 lb (73.9 kg)   BMI 30.80 kg/m   Alert and oriented and in no acute distress.  She denies pain or tenderness when pressing on her pinna.  Denies mastoid tenderness      Assessment & Plan:  Acute otitis media, left - Plan: amoxicillin (AMOXIL) 875 MG tablet  Elevated LDL cholesterol level  Discussed limitations of a virtual visit.  I will treat her for acute otitis media.  We also discussed continuing on antihistamine for underlying allergies as well as Sudafed as needed.  She may take Tylenol or ibuprofen as well. Recommend if she is worsening that she be seen at an urgent care or potentially here if no new symptoms arise.  She will let me know if she is not back to baseline after completing the antibiotic.  We did discuss possible GI symptoms or decreased effectiveness of birth control and she will take precautions.  Counseling on healthy diet and exercise for elevated LDL.  We also discussed the familial component may be present.  She will speak with her nutritionist about this as well.  Recommend she return for a 32-month fasting lipid panel  Time spent on call was 18 minutes and in review of previous records 20 minutes total.  This virtual service is not related to other E/M service within previous 7 days.

## 2019-01-07 ENCOUNTER — Other Ambulatory Visit: Payer: Self-pay

## 2019-01-07 DIAGNOSIS — Z20822 Contact with and (suspected) exposure to covid-19: Secondary | ICD-10-CM

## 2019-01-10 ENCOUNTER — Telehealth: Payer: Self-pay | Admitting: Psychiatry

## 2019-01-10 LAB — NOVEL CORONAVIRUS, NAA: SARS-CoV-2, NAA: NOT DETECTED

## 2019-01-10 NOTE — Telephone Encounter (Signed)
Phone call returned to [place] at [phone] as she requests and reports starting Cymbalta 2 days ago on the day she obtained a negative coronavirus test performed as she had diagnosis of otitis medias the day before worried also about visits with grandmother.  Her waking from sleep with shaking but normal vitals suggests a mild panic attack likely vestibular subtype though she also reports feeling a little shaky through the morning also.  She seems to mainly prefer taking Cymbalta or similar medicine as she has reduced the Ativan dose to 1 mg twice daily or as needed and is pleased with that now both for semiprevention role lab as needed for rescue  She asks if she needs to call back again and I suggested she have confidence that panic and generalized anxiety are not physically harmful but interfere with quality life and sometimes daily function and that she is for establishing an approach she needs to reinforce in commitment.  Cymbalta is discontinued but discussed that another trial when not medically ill or stressed by family has no contraindication and is acceptable if she agrees.

## 2019-01-10 NOTE — Telephone Encounter (Signed)
Pt called to report she started Cymbalta 2 days ago. 1/d 20 mg cap. She woke up in the middle of the night shaking. Heart & pulse ok. She had been taking @ night. Please advise @ [phone]

## 2019-01-15 ENCOUNTER — Encounter: Payer: Self-pay | Admitting: Family Medicine

## 2019-02-18 ENCOUNTER — Encounter: Payer: Self-pay | Admitting: Family Medicine

## 2019-02-19 ENCOUNTER — Ambulatory Visit: Payer: BC Managed Care – PPO | Attending: Internal Medicine

## 2019-02-19 DIAGNOSIS — Z20822 Contact with and (suspected) exposure to covid-19: Secondary | ICD-10-CM

## 2019-02-20 LAB — NOVEL CORONAVIRUS, NAA: SARS-CoV-2, NAA: NOT DETECTED

## 2019-02-27 NOTE — Telephone Encounter (Signed)
Pt was seen on 02/26/2019 with Dr. Lazarus Salines

## 2019-06-11 ENCOUNTER — Encounter: Payer: Self-pay | Admitting: Family Medicine

## 2019-06-11 ENCOUNTER — Ambulatory Visit: Payer: BC Managed Care – PPO | Admitting: Family Medicine

## 2019-06-11 ENCOUNTER — Other Ambulatory Visit: Payer: Self-pay

## 2019-06-11 VITALS — BP 122/70 | HR 71 | Temp 98.0°F | Wt 177.0 lb

## 2019-06-11 DIAGNOSIS — E7800 Pure hypercholesterolemia, unspecified: Secondary | ICD-10-CM | POA: Diagnosis not present

## 2019-06-11 DIAGNOSIS — R42 Dizziness and giddiness: Secondary | ICD-10-CM | POA: Diagnosis not present

## 2019-06-11 NOTE — Progress Notes (Signed)
   Subjective:    Patient ID: Daisy Singleton, female    DOB: 10-May-1990, 29 y.o.   MRN: [id number]  HPI Chief Complaint  Patient presents with  . fasting recheck    fasting recheck on cholesterol   She is here to discuss elevated LDL. Would like to have her cholesterol rechecked.   States she eats a lot of chicken.  Feels like she has a healthy diet.   No regular exercise since her last visit. States the only difference she can think of when her LDL was much lower was her exercise. She was exercising much more often in 2019.   Family history of HL.   States she has been having intermittent dizziness since her ear infection. States she thinks it is only associated with turning her head to the right. Lasts very briefly. No headache, vision changes, chest pain, palpitations, shortness of breath, N/V/D     Review of Systems Pertinent positives and negatives in the history of present illness.     Objective:   Physical Exam Constitutional:      Appearance: Normal appearance.  Eyes:     General: Lids are normal. Vision grossly intact.     Extraocular Movements: Extraocular movements intact.     Right eye: No nystagmus.     Left eye: No nystagmus.     Conjunctiva/sclera: Conjunctivae normal.     Pupils: Pupils are equal, round, and reactive to light.  Musculoskeletal:     Cervical back: Normal range of motion and neck supple.  Neurological:     General: No focal deficit present.     Mental Status: She is alert.     Cranial Nerves: Cranial nerves are intact.     Sensory: Sensation is intact.     Motor: Motor function is intact.     Coordination: Coordination is intact.     Gait: Gait is intact.    BP 122/70   Pulse 71   Temp 98 F (36.7 C)   Wt 177 lb (80.3 kg)   BMI 33.44 kg/m        Assessment & Plan:  Elevated LDL cholesterol level - Plan: Lipid panel  Dizziness  Counseling on potential long-term health consequences associated with hyperlipidemia.   Counseling on healthy diet and exercise to help lower cholesterol. Check fasting lipids in follow-up During the visit she briefly mentions recent episodes of dizziness that she describes as positional vertigo.  Encouraged her to stay well-hydrated.  She may try meclizine.  Epley maneuvers printed out and offered to send her to PT if the symptoms persist.

## 2019-06-11 NOTE — Patient Instructions (Signed)
Stay well hydrated. You may try meclizine 12.5 or 25 mg if needed. Be careful since it can cause drowsiness in some people.  Let me know if this is getting worse.   I will be in touch with your cholesterol results.     How to Perform the Epley Maneuver The Epley maneuver is an exercise that relieves symptoms of vertigo. Vertigo is the feeling that you or your surroundings are moving when they are not. When you feel vertigo, you may feel like the room is spinning and have trouble walking. Dizziness is a little different than vertigo. When you are dizzy, you may feel unsteady or light-headed. You can do this maneuver at home whenever you have symptoms of vertigo. You can do it up to 3 times a day until your symptoms go away. Even though the Epley maneuver may relieve your vertigo for a few weeks, it is possible that your symptoms will return. This maneuver relieves vertigo, but it does not relieve dizziness. What are the risks? If it is done correctly, the Epley maneuver is considered safe. Sometimes it can lead to dizziness or nausea that goes away after a short time. If you develop other symptoms, such as changes in vision, weakness, or numbness, stop doing the maneuver and call your health care provider. How to perform the Epley maneuver 1. Sit on the edge of a bed or table with your back straight and your legs extended or hanging over the edge of the bed or table. 2. Turn your head halfway toward the affected ear or side. 3. Lie backward quickly with your head turned until you are lying flat on your back. You may want to position a pillow under your shoulders. 4. Hold this position for 30 seconds. You may experience an attack of vertigo. This is normal. 5. Turn your head to the opposite direction until your unaffected ear is facing the floor. 6. Hold this position for 30 seconds. You may experience an attack of vertigo. This is normal. Hold this position until the vertigo stops. 7. Turn your  whole body to the same side as your head. Hold for another 30 seconds. 8. Sit back up. You can repeat this exercise up to 3 times a day. Follow these instructions at home:  After doing the Epley maneuver, you can return to your normal activities.  Ask your health care provider if there is anything you should do at home to prevent vertigo. He or she may recommend that you: ? Keep your head raised (elevated) with two or more pillows while you sleep. ? Do not sleep on the side of your affected ear. ? Get up slowly from bed. ? Avoid sudden movements during the day. ? Avoid extreme head movement, like looking up or bending over. Contact a health care provider if:  Your vertigo gets worse.  You have other symptoms, including: ? Nausea. ? Vomiting. ? Headache. Get help right away if:  You have vision changes.  You have a severe or worsening headache or neck pain.  You cannot stop vomiting.  You have new numbness or weakness in any part of your body. Summary  Vertigo is the feeling that you or your surroundings are moving when they are not.  The Epley maneuver is an exercise that relieves symptoms of vertigo.  If the Epley maneuver is done correctly, it is considered safe. You can do it up to 3 times a day. This information is not intended to replace advice given to  you by your health care provider. Make sure you discuss any questions you have with your health care provider. Document Revised: 01/05/2017 Document Reviewed: 12/14/2015 Elsevier Patient Education  2020 Elsevier Inc.    High Cholesterol  High cholesterol is a condition in which the blood has high levels of a white, waxy, fat-like substance (cholesterol). The human body needs small amounts of cholesterol. The liver makes all the cholesterol that the body needs. Extra (excess) cholesterol comes from the food that we eat. Cholesterol is carried from the liver by the blood through the blood vessels. If you have high  cholesterol, deposits (plaques) may build up on the walls of your blood vessels (arteries). Plaques make the arteries narrower and stiffer. Cholesterol plaques increase your risk for heart attack and stroke. Work with your health care provider to keep your cholesterol levels in a healthy range. What increases the risk? This condition is more likely to develop in people who:  Eat foods that are high in animal fat (saturated fat) or cholesterol.  Are overweight.  Are not getting enough exercise.  Have a family history of high cholesterol. What are the signs or symptoms? There are no symptoms of this condition. How is this diagnosed? This condition may be diagnosed from the results of a blood test.  If you are older than age 7, your health care provider may check your cholesterol every 4-6 years.  You may be checked more often if you already have high cholesterol or other risk factors for heart disease. The blood test for cholesterol measures:  "Bad" cholesterol (LDL cholesterol). This is the main type of cholesterol that causes heart disease. The desired level for LDL is less than 100.  "Good" cholesterol (HDL cholesterol). This type helps to protect against heart disease by cleaning the arteries and carrying the LDL away. The desired level for HDL is 60 or higher.  Triglycerides. These are fats that the body can store or burn for energy. The desired number for triglycerides is lower than 150.  Total cholesterol. This is a measure of the total amount of cholesterol in your blood, including LDL cholesterol, HDL cholesterol, and triglycerides. A healthy number is less than 200. How is this treated? This condition is treated with diet changes, lifestyle changes, and medicines. Diet changes  This may include eating more whole grains, fruits, vegetables, nuts, and fish.  This may also include cutting back on red meat and foods that have a lot of added sugar. Lifestyle changes  Changes  may include getting at least 40 minutes of aerobic exercise 3 times a week. Aerobic exercises include walking, biking, and swimming. Aerobic exercise along with a healthy diet can help you maintain a healthy weight.  Changes may also include quitting smoking. Medicines  Medicines are usually given if diet and lifestyle changes have failed to reduce your cholesterol to healthy levels.  Your health care provider may prescribe a statin medicine. Statin medicines have been shown to reduce cholesterol, which can reduce the risk of heart disease. Follow these instructions at home: Eating and drinking If told by your health care provider:  Eat chicken (without skin), fish, veal, shellfish, ground [place] breast, and round or loin cuts of red meat.  Do not eat fried foods or fatty meats, such as hot dogs and salami.  Eat plenty of fruits, such as apples.  Eat plenty of vegetables, such as broccoli, potatoes, and carrots.  Eat beans, peas, and lentils.  Eat grains such as barley, rice, couscous,  and bulgur wheat.  Eat pasta without cream sauces.  Use skim or nonfat milk, and eat low-fat or nonfat yogurt and cheeses.  Do not eat or drink whole milk, cream, ice cream, egg yolks, or hard cheeses.  Do not eat stick margarine or tub margarines that contain trans fats (also called partially hydrogenated oils).  Do not eat saturated tropical oils, such as coconut oil and palm oil.  Do not eat cakes, cookies, crackers, or other baked goods that contain trans fats.  General instructions  Exercise as directed by your health care provider. Increase your activity level with activities such as gardening, walking, and taking the stairs.  Take over-the-counter and prescription medicines only as told by your health care provider.  Do not use any products that contain nicotine or tobacco, such as cigarettes and e-cigarettes. If you need help quitting, ask your health care provider.  Keep all  follow-up visits as told by your health care provider. This is important. Contact a health care provider if:  You are struggling to maintain a healthy diet or weight.  You need help to start on an exercise program.  You need help to stop smoking. Get help right away if:  You have chest pain.  You have trouble breathing. This information is not intended to replace advice given to you by your health care provider. Make sure you discuss any questions you have with your health care provider. Document Revised: 01/26/2017 Document Reviewed: 07/24/2015 Elsevier Patient Education  [place].

## 2019-06-12 LAB — LIPID PANEL
Chol/HDL Ratio: 4.5 ratio — ABNORMAL HIGH (ref 0.0–4.4)
Cholesterol, Total: 208 mg/dL — ABNORMAL HIGH (ref 100–199)
HDL: 46 mg/dL (ref >39)
LDL Chol Calc (NIH): 150 mg/dL — ABNORMAL HIGH (ref 0–99)
Triglycerides: 68 mg/dL (ref 0–149)
VLDL Cholesterol Cal: 12 mg/dL (ref 5–40)

## 2019-06-18 ENCOUNTER — Other Ambulatory Visit: Payer: Self-pay

## 2019-06-18 ENCOUNTER — Ambulatory Visit (INDEPENDENT_AMBULATORY_CARE_PROVIDER_SITE_OTHER): Payer: BC Managed Care – PPO | Admitting: Psychiatry

## 2019-06-18 ENCOUNTER — Encounter: Payer: Self-pay | Admitting: Psychiatry

## 2019-06-18 VITALS — Ht 61.0 in | Wt 173.0 lb

## 2019-06-18 DIAGNOSIS — F410 Panic disorder [episodic paroxysmal anxiety] without agoraphobia: Secondary | ICD-10-CM

## 2019-06-18 DIAGNOSIS — F411 Generalized anxiety disorder: Secondary | ICD-10-CM

## 2019-06-18 MED ORDER — LORAZEPAM 0.5 MG PO TABS: 0.7500 mg | ORAL_TABLET | Freq: Two times a day (BID) | ORAL | 2 refills | Status: DC | PRN

## 2019-06-18 NOTE — Progress Notes (Signed)
[facility]  Patient ID: Daisy Singleton,  MRN: [id number]  PCP: Girtha Rm, NP-C  Date of Evaluation: 06/18/2019 Time spent: 20 minutes from 1300 to 1320  Chief Complaint:  Chief Complaint    Anxiety; Panic Attack      HISTORY/CURRENT STATUS: Daisy Singleton is seen onsite in office 20 minutes face-to-face individually with consent with epic collateral for psychiatric interview and exam in 53-month evaluation and management of generalized and panic anxiety.  She reschedules as she has had 2 panic attacks in her sleep recently awaking her severe after otherwise having had a good day.  She takes Ativan for such panic or for insomnia having complex distressing dreams.  She has had recent ear infection followed by hearing changes with elevated LDL cholesterol and Nexplanon insertion thereby having much medical going on.  She worries about husband as to his cardiac status imagining or dreaming that he perished from such.  She looks forward to another job at [employer] having an interview next week.  She asks whether Ativan might cause dizziness likely arising in the ear being treated by ENT.  She feels that 1 mg of Ativan makes her too sluggish thinking she should take one half of the next lower dose.  She agreed to Cymbalta 30 mg for panic attacks and generalized anxiety last appointment getting worse after stopping Zoloft.  She phoned 4 weeks just starting Cymbalta wanting to stop the Cymbalta having shaking in her sleep after a negative Covid test and most likely panic in her sleep.  She has therapy with Daisy Singleton, [facility] at [facility] in [place].  There is no mania, suicidality, psychosis or delirium.  Anxiety             Presents forfollow-upvisit. Symptoms includesomatic and psychic stress, tension myalgia, excessive worry,hyperventilation,insomnia,nervous/anxious behavior,palpitations, panicand shortness of breath. Patient reports noconfusion,decreased  concentration,depressed mood,malaise,nausea,palpitationsor suicidal ideas. Symptoms occurmost days. The severity of symptoms isinterfering with daily activities and moderate. The quality of sleep isfair. Nighttime awakenings:occasional. Compliance with medications is26-50%.  Individual Medical History/ Review of Systems: Changes? :Yes Weight is up 6 pounds having recent elevated LDL cholesterol, otitis media, and Nexplanon insertion by dizziness being assessed further by ENT  Allergies: Latex  Current Medications:  Current Outpatient Medications:  .  Clobetasol Propionate 0.05 % shampoo, , Disp: , Rfl:  .  Etonogestrel (NEXPLANON [place]), Inject into the skin., Disp: , Rfl:  .  LORazepam (ATIVAN) 0.5 MG tablet, Take 1.5 tablets (0.75 mg total) by mouth 2 (two) times daily as needed for anxiety (Panic)., Disp: 90 tablet, Rfl: 2 .  PREVIDENT 5000 BOOSTER PLUS 1.1 % PSTE, toothpaste, Disp: , Rfl: 0  Medication Side Effects: dizziness/lightheadedness and hypersomnolence  Family Medical/ Social History: Changes? No  MENTAL HEALTH EXAM:  Height 5\' 1"  (1.549 m), weight 173 lb (78.5 kg).Body mass index is 32.69 kg/m. Muscle strengths and tone 5/5, postural reflexes and gait 0/0, and AIMS = 0.  General Appearance: Casual, Meticulous, Well Groomed and Obese  Eye Contact:  Fair  Speech:  Clear and Coherent, Normal Rate and Talkative  Volume:  Normal  Mood:  Anxious and Euthymic  Affect:  Congruent, Depressed, Inappropriate, Labile and Anxious  Thought Process:  Coherent, Goal Directed, Irrelevant and Descriptions of Associations: Tangential  Orientation:  Full (Time, Place, and Person)  Thought Content: Ilusions, Rumination and Tangential   Suicidal Thoughts:  No  Homicidal Thoughts:  No  Memory:  Immediate;   Good Remote;   Good  Judgement:  Fair  Insight:  Fair  Psychomotor Activity:  Normal and Mannerisms  Concentration:  Concentration: Fair and Attention Span: Good  Recall:   Good  Fund of Knowledge: Good  Language: Good  Assets:  Resilience Social Support Talents/Skills Vocational/Educational  ADL's:  Intact  Cognition: WNL  Prognosis:  Good    DIAGNOSES:    ICD-10-CM   1. Generalized anxiety disorder  F41.1 LORazepam (ATIVAN) 0.5 MG tablet  2. Panic disorder  F41.0 LORazepam (ATIVAN) 0.5 MG tablet    Receiving Psychotherapy: Yes  with Daisy Singleton, [facility] in [place]   RECOMMENDATIONS: Psychosupportive psychoeducation addresses symptom treatment matching for interface with cognitive behavioral issues from therapy for patients exposure desensitization response prevention as she emphasizes therapy over medication.  She therefore uses only Ativan 0.5 mg from 0.5 to 1.5 tablets up to twice daily as needed for panic symptoms escribed as #90 with 2 refills to [facility] [address] generalized anxiety and panic disorder..  She is not taking Cymbalta and allows no plans for Pristiq today.  She is provided warnings and risk of diagnoses and treatment including medication for prevention and monitoring and safety hygiene including crisis plan.  Teaching about panic and medical interface of medication and therapy management is provided.  She returns in 6 months or sooner if needed while continuing therapy.   Chauncey Mann, MD

## 2019-10-02 ENCOUNTER — Other Ambulatory Visit: Payer: Self-pay

## 2019-10-02 ENCOUNTER — Ambulatory Visit (INDEPENDENT_AMBULATORY_CARE_PROVIDER_SITE_OTHER): Payer: BC Managed Care – PPO | Admitting: Psychiatry

## 2019-10-02 ENCOUNTER — Encounter: Payer: Self-pay | Admitting: Psychiatry

## 2019-10-02 VITALS — Ht 61.0 in | Wt 180.0 lb

## 2019-10-02 DIAGNOSIS — F411 Generalized anxiety disorder: Secondary | ICD-10-CM | POA: Diagnosis not present

## 2019-10-02 DIAGNOSIS — F331 Major depressive disorder, recurrent, moderate: Secondary | ICD-10-CM

## 2019-10-02 DIAGNOSIS — F410 Panic disorder [episodic paroxysmal anxiety] without agoraphobia: Secondary | ICD-10-CM

## 2019-10-02 DIAGNOSIS — F330 Major depressive disorder, recurrent, mild: Secondary | ICD-10-CM | POA: Insufficient documentation

## 2019-10-02 MED ORDER — FLUVOXAMINE MALEATE 100 MG PO TABS: 100.0000 mg | ORAL_TABLET | Freq: Every day | ORAL | 1 refills | Status: DC

## 2019-10-02 NOTE — Progress Notes (Signed)
[facility]  Patient ID: Daisy Singleton,  MRN: [id number]  PCP: Avanell Shackleton, NP-C  Date of Evaluation: 10/02/2019 Time spent:25 minutes from 1605 to 1630  Chief Complaint:  Chief Complaint    Depression; Anxiety; Panic Attack      HISTORY/CURRENT STATUS: Daisy Singleton is seen onsite in office 25 minutes face-to-face individually for acute work in appointment with consent with epic collateral for psychiatric interview and exam in 47-month evaluation and management of generalized and panic anxiety now with acute relapse of depression.  Anxiety often limits the patient's participation in treatment especially with medications. Subsequent to last  appointment, she has improved comfort, understanding, capability to treat her anxiety with Ativan 0.5 mg she now considers her optimal treatment for anxieties.  She did not become comfortable or successful with Zoloft or Cymbalta in the past.  For several weeks, she is having crying spells, disinterest, guilty rumination, insomnia not yet terminal pattern, and emptiness to thought and feeling missing work 4 days ago doubting she can keep going.  She feels sad and particularly somatically bad.  Husband recognizes depression convincing her to restart the Cymbalta which she did for 5 days low dose 20 mg experiencing shaking and less sleep.  Nexplanon was last inserted in March and GYN gave her short course of birth control pill for 2 months apparently for abnormal uterine bleeding.  [place] registry documents second fill of 06/18/2019 last Ativan E scription on 09/23/2019 having one refill remaining with appropriate use in the last year including per epic.  She is training for short course triathlon end September confident she can complete the race as she has finished twice in practice, but the exercise is not relieving depression.  She is not suicidal or self harming.  She terminated her female therapist with [facility] for lack of efficacy and  therapeutic alliance.  She was depressed in 2013 waiting it out and resolving in time by quitting her job. 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  She has no mania, suicidality, psychosis or delirium.  Anxiety Presents forfollow-upvisit also of anxiety. Symptoms includesomatic and psychic stress, tension myalgia, excessive worry,insomnia,nervous/anxious behavior,confusion,decreased concentration,depressed mood, and melancholic features. Symptoms do not currently include hyperventilation, palpitations, panic, malaise,nausea, shortness of breath, self harm, misperceptions, or suicidal ideation.  Symptoms occurmost days. The severity of symptoms isinterfering with daily activities and moderate. The quality of sleep isfair. Nighttime awakenings:occasional. Compliance with medications is26-50%.  Individual Medical History/ Review of Systems: Changes? :Yes  Nexplanon was last inserted in March and GYN gave her short course of birth control pill for 2 months apparently for abnormal uterine bleeding.  Weight is up 7 pounds in 3 months as she is training for triathlon.  Allergies: Latex  Current Medications:  Current Outpatient Medications:  .  Clobetasol Propionate 0.05 % shampoo, , Disp: , Rfl:  .  Etonogestrel (NEXPLANON [place]), Inject into the skin., Disp: , Rfl:  .  fluvoxaMINE (LUVOX) 100 MG tablet, Take 1 tablet (100 mg total) by mouth at bedtime., Disp: 30 tablet, Rfl: 1 .  LORazepam (ATIVAN) 0.5 MG tablet, Take 1.5 tablets (0.75 mg total) by mouth 2 (two) times daily as needed for anxiety (Panic)., Disp: 90 tablet, Rfl: 2 .  PREVIDENT 5000 BOOSTER PLUS 1.1 % PSTE, toothpaste, Disp: , Rfl: 0  Medication Side Effects: insomnia and tremulousness  Family Medical/ Social History: Changes? Yes has anxiety and father tends toward being counterphobic  MENTAL HEALTH EXAM:  Height 5\' 1"  (1.549 m), weight 180 lb (81.6 kg).Body mass index is 34.01  kg/m. Muscle strengths and tone 5/5, postural reflexes and gait 0/0, and AIMS = 0.Muscle strengths and tone 5/5, postural reflexes and gait 0/0, and AIMS = 0.  General Appearance: Casual, Guarded, Meticulous, Well Groomed and Obese  Eye Contact:  Fair  Speech:  Clear and Coherent, Normal Rate and Talkative  Volume:  Normal  Mood:  Anxious, Depressed, Dysphoric, Hopeless and Worthless  Affect:  Congruent, Constricted, Depressed, Tearful and Anxious  Thought Process:  Coherent, Goal Directed, Irrelevant and Descriptions of Associations: Tangential  Orientation:  Full (Time, Place, and Person)  Thought Content: Ilusions, Rumination and Tangential   Suicidal Thoughts:  No  Homicidal Thoughts:  No  Memory:  Immediate;   Good Remote;   Good  Judgement:  Good  Insight:  Fair  Psychomotor Activity:  Normal, Decreased, Mannerisms and Psychomotor Retardation  Concentration:  Concentration: Fair and Attention Span: Good  Recall:  Good  Fund of Knowledge: Good  Language: Good  Assets:  Desire for Improvement Resilience Social Support Vocational/Educational  ADL's:  Intact  Cognition: WNL  Prognosis:  Good    DIAGNOSES:    ICD-10-CM   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 Generalized anxiety disorder  F41.1 fluvoxaMINE (LUVOX) 100 MG tablet  3. Panic disorder  F41.0 fluvoxaMINE (LUVOX) 100 MG tablet    Receiving Psychotherapy: No  terminating with , [facility] [place] for his lack of help and investment in her therapy, now needing a new therapist   RECOMMENDATIONS: Husband is her best support though she is considering another therapist.  She will continue her triathlon training for the race end of September and is medically up-to-date for general health.  In the course of 5 years of treatment here, patient is well educated on medications not definitely discussing her depressive episode in 2013.   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  Symptom treatment matching concludes to start Luvox also discussed as option in appointment 04/10/2018.  Luvox is E scribed 100 mg tablet taking 1/2 tablet 50 mg every bedtime for four nights then advance to 1 tablet every bedtime sent as #30 with one refill to [facility] on 06/10[facility]2018 and [place] not increasing anxiety by repeating previous education on things and risk.  She has Ativan 0.5 mg daily as needed for anxieties she returns for follow-up in 3 weeks to further discuss my upcoming retirement further at that time also.  [employer], MD

## 2019-10-27 ENCOUNTER — Ambulatory Visit (INDEPENDENT_AMBULATORY_CARE_PROVIDER_SITE_OTHER): Payer: BC Managed Care – PPO | Admitting: Psychiatry

## 2019-10-27 ENCOUNTER — Other Ambulatory Visit: Payer: Self-pay

## 2019-10-27 ENCOUNTER — Encounter: Payer: Self-pay | Admitting: Psychiatry

## 2019-10-27 VITALS — Ht 61.0 in | Wt 180.0 lb

## 2019-10-27 DIAGNOSIS — F410 Panic disorder [episodic paroxysmal anxiety] without agoraphobia: Secondary | ICD-10-CM

## 2019-10-27 DIAGNOSIS — F411 Generalized anxiety disorder: Secondary | ICD-10-CM | POA: Diagnosis not present

## 2019-10-27 DIAGNOSIS — F330 Major depressive disorder, recurrent, mild: Secondary | ICD-10-CM

## 2019-10-27 MED ORDER — FLUVOXAMINE MALEATE 100 MG PO TABS: 100.0000 mg | ORAL_TABLET | Freq: Every day | ORAL | 2 refills | Status: DC

## 2019-10-27 MED ORDER — LORAZEPAM 0.5 MG PO TABS: 0.5000 mg | ORAL_TABLET | Freq: Two times a day (BID) | ORAL | 2 refills | Status: DC | PRN

## 2019-10-27 NOTE — Progress Notes (Signed)
[facility]  Patient ID: Daisy Singleton,  MRN: [id number]  PCP: Daisy Shackleton, NP-C  Date of Evaluation: 10/27/2019 Time spent:25 minutes from 1510 to 1535  Chief Complaint:  Chief Complaint    Anxiety; Panic Attack; Depression      HISTORY/CURRENT STATUS: Daisy Singleton is seen onsite in office 25 minutes face-to-face individually with consent with epic collateral for psychiatric interview and exam in 3.5-week evaluation and management of major depression, generalized and panic anxiety, and curious experiential learning pattern seemingly necessary to her for meaningful outcome.  Daisy Singleton has care here for 5 years episodically for anxiety though last appointment she disclosed that she has been significantly depressed in 2013 now again in recurrence. Husband has more serious cardiac disorder, while patient reflects anxiety like mother and counterphobia like father in her panic and generalized worry.  However, her husband has become trainer and copatriot  in triathlon preparations of patient incorporating anxiety and depression relief now ready the last 10 days for the modified race this coming Sunday.  The patient has been very cautious about antianxiety antidepressants having some discontinuation symptoms in the past and through the last 5 years taking Zoloft at various doses, Neurontin, Prozac, Cymbalta, and Ativan.  The patient was emotionally prepared to start Luvox medication last appointment for relief of depression and anxiety, and now she has accomplished remission of sick crying to feel and function much better again.  However she describes 16 days of sleepless shaking with nausea and episodic emesis making training for the triathlon challenging but all seeming to now at least over a week render her no longer depressed getting her work done wanting to go places and do things.  Husband had to push her to be persistent getting these things accomplished. Ativan helps significantly now  taking just 0.5 mg instead of 1 1/2 of the 0.5 mg tablets.  She is off of the birth control pill supplementing her Nexplanon with normal menstrual flow now.  She has no explanation for this 2-week adaptation except for a training motto such as no pain no gain but now feels she is prepared for the next 6 months.  She has no mania, suicidality, psychosis or delirium.  Anxiety Presents forfollow-upof anxiety and depression. Symptoms included somatic and psychic stress, tension myalgia, excessive worry,insomnia,nervous/anxious behavior,confusion,decreased concentration,depressed mood, and melancholic features all now improving so current symptoms do not currently include hyperventilation, emesis,tremulousness,  palpitations,panic, malaise,nausea, shortness of breath, self harm, misperceptions, or suicidal ideation.  Symptoms occur now infrequently. The severity of symptoms is no longerinterfering with daily activities and moderate. The quality of sleep isfair. Nighttime awakenings:occasional. Compliance with medications is26-50%.  Individual Medical History/ Review of Systems: Changes? :No Now off birth control pill  Allergies: Latex  Current Medications:  Current Outpatient Medications:  .  Clobetasol Propionate 0.05 % shampoo, , Disp: , Rfl:  .  Etonogestrel (NEXPLANON [place]), Inject into the skin., Disp: , Rfl:  .  fluvoxaMINE (LUVOX) 100 MG tablet, Take 1 tablet (100 mg total) by mouth at bedtime., Disp: 90 tablet, Rfl: 2 .  LORazepam (ATIVAN) 0.5 MG tablet, Take 1 tablet (0.5 mg total) by mouth 2 (two) times daily as needed for anxiety (Panic)., Disp: 60 tablet, Rfl: 2 .  PREVIDENT 5000 BOOSTER PLUS 1.1 % PSTE, toothpaste, Disp: , Rfl: 0  Medication Side Effects: none now after resolution of nausea, vomiting, tremulousness, and insomnia,  Family Medical/ Social History: Changes? No  MENTAL HEALTH EXAM:  Height 5\' 1"  (1.549 m), weight 180  lb (81.6 kg).Body mass index is  34.01 kg/m. Muscle strengths and tone 5/5, postural reflexes and gait 0/0, and AIMS = 0.  General Appearance: Casual, Meticulous, Well Groomed and Obese  Eye Contact:  Good  Speech:  Clear and Coherent, Normal Rate and Talkative  Volume:  Normal  Mood:  Anxious, Dysphoric and Euthymic  Affect:  Appropriate, Congruent, Inappropriate and Anxious  Thought Process:  Coherent, Goal Directed, Irrelevant and Descriptions of Associations: Tangential  Orientation:  Full (Time, Place, and Person)  Thought Content: Ilusions, Rumination and Tangential   Suicidal Thoughts:  No  Homicidal Thoughts:  No  Memory:  Immediate;   Good Remote;   Good  Judgement:  Good  Insight:  Fair  Psychomotor Activity:  Normal and Mannerisms  Concentration:  Concentration: Good and Attention Span: Good  Recall:  Good  Fund of Knowledge: Good  Language: Good  Assets:  Desire for Improvement Resilience Social Support Talents/Skills  ADL's:  Intact  Cognition: WNL  Prognosis:  Good    DIAGNOSES:    ICD-10-CM   1. Generalized anxiety disorder  F41.1 fluvoxaMINE (LUVOX) 100 MG tablet    LORazepam (ATIVAN) 0.5 MG tablet  2. Panic disorder  F41.0 fluvoxaMINE (LUVOX) 100 MG tablet    LORazepam (ATIVAN) 0.5 MG tablet  3. 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  Psychosupportive psychoeducation consolidates accomplishments and autonomic control to return in 6 months or sooner if needed endowed with prevention and monitoring safety hygiene and closure for my imminent retirement to follow-up with advanced practitioner.  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  She is E scribed Ativan 0.5  mg twice daily as needed for panic or high anxiety sent as #60 with 2 refills to [facility] [address] for panic and generalized anxiety.  She is E scribed Luvox 100 mg every bedtime sent as #90 with 2 refills  to [facility] [address] for panic and generalized anxiety and depression.  She is now ready for her triathlon race this coming Sunday.   Chauncey Mann, MD

## 2019-11-17 ENCOUNTER — Other Ambulatory Visit: Payer: Self-pay | Admitting: Physician Assistant

## 2019-11-17 DIAGNOSIS — N6001 Solitary cyst of right breast: Secondary | ICD-10-CM

## 2019-11-26 ENCOUNTER — Encounter: Payer: Self-pay | Admitting: Psychiatry

## 2019-12-03 ENCOUNTER — Encounter: Payer: Self-pay | Admitting: Internal Medicine

## 2019-12-05 ENCOUNTER — Encounter: Payer: BC Managed Care – PPO | Admitting: Family Medicine

## 2019-12-09 ENCOUNTER — Ambulatory Visit
Admission: RE | Admit: 2019-12-09 | Discharge: 2019-12-09 | Disposition: A | Discharge status: Home / Self Care | Payer: BC Managed Care – PPO | Source: Ambulatory Visit | Attending: Physician Assistant | Admitting: Physician Assistant

## 2019-12-09 ENCOUNTER — Other Ambulatory Visit: Payer: Self-pay

## 2019-12-09 DIAGNOSIS — N6001 Solitary cyst of right breast: Secondary | ICD-10-CM

## 2019-12-11 ENCOUNTER — Encounter: Payer: BC Managed Care – PPO | Admitting: Family Medicine

## 2019-12-18 ENCOUNTER — Ambulatory Visit: Payer: BC Managed Care – PPO | Admitting: Psychiatry

## 2019-12-30 ENCOUNTER — Other Ambulatory Visit: Payer: Self-pay

## 2019-12-30 ENCOUNTER — Ambulatory Visit (HOSPITAL_COMMUNITY)
Admission: EM | Admit: 2019-12-30 | Discharge: 2019-12-30 | Disposition: A | Discharge status: Home / Self Care | Payer: BC Managed Care – PPO

## 2020-01-04 NOTE — Patient Instructions (Addendum)
Return for fasting labs.   I will be in touch with your results.    Preventive Care 31-31 Years Old, Female Preventive care refers to visits with your health care provider and lifestyle choices that can promote health and wellness. This includes:  A yearly physical exam. This may also be called an annual well check.  Regular dental visits and eye exams.  Immunizations.  Screening for certain conditions.  Healthy lifestyle choices, such as eating a healthy diet, getting regular exercise, not using drugs or products that contain nicotine and tobacco, and limiting alcohol use. What can I expect for my preventive care visit? Physical exam Your health care provider will check your:  Height and weight. This may be used to calculate body mass index (BMI), which tells if you are at a healthy weight.  Heart rate and blood pressure.  Skin for abnormal spots. Counseling Your health care provider may ask you questions about your:  Alcohol, tobacco, and drug use.  Emotional well-being.  Home and relationship well-being.  Sexual activity.  Eating habits.  Work and work Statistician.  Method of birth control.  Menstrual cycle.  Pregnancy history. What immunizations do I need?  Influenza (flu) vaccine  This is recommended every year. Tetanus, diphtheria, and pertussis (Tdap) vaccine  You may need a Td booster every 10 years. Varicella (chickenpox) vaccine  You may need this if you have not been vaccinated. Human papillomavirus (HPV) vaccine  If recommended by your health care provider, you may need three doses over 6 months. Measles, mumps, and rubella (MMR) vaccine  You may need at least one dose of MMR. You may also need a second dose. Meningococcal conjugate (MenACWY) vaccine  One dose is recommended if you are age 10-21 years and a first-year college student living in a residence hall, or if you have one of several medical conditions. You may also need additional  booster doses. Pneumococcal conjugate (PCV13) vaccine  You may need this if you have certain conditions and were not previously vaccinated. Pneumococcal polysaccharide (PPSV23) vaccine  You may need one or two doses if you smoke cigarettes or if you have certain conditions. Hepatitis A vaccine  You may need this if you have certain conditions or if you travel or work in places where you may be exposed to hepatitis A. Hepatitis B vaccine  You may need this if you have certain conditions or if you travel or work in places where you may be exposed to hepatitis B. Haemophilus influenzae type b (Hib) vaccine  You may need this if you have certain conditions. You may receive vaccines as individual doses or as more than one vaccine together in one shot (combination vaccines). Talk with your health care provider about the risks and benefits of combination vaccines. What tests do I need?  Blood tests  Lipid and cholesterol levels. These may be checked every 5 years starting at age 31.  Hepatitis C test.  Hepatitis B test. Screening  Diabetes screening. This is done by checking your blood sugar (glucose) after you have not eaten for a while (fasting).  Sexually transmitted disease (STD) testing.  BRCA-related cancer screening. This may be done if you have a family history of breast, ovarian, tubal, or peritoneal cancers.  Pelvic exam and Pap test. This may be done every 3 years starting at age 31. Starting at age 45, this may be done every 5 years if you have a Pap test in combination with an HPV test. Talk with your  health care provider about your test results, treatment options, and if necessary, the need for more tests. Follow these instructions at home: Eating and drinking   Eat a diet that includes fresh fruits and vegetables, whole grains, lean protein, and low-fat dairy.  Take vitamin and mineral supplements as recommended by your health care provider.  Do not drink alcohol  if: ? Your health care provider tells you not to drink. ? You are pregnant, may be pregnant, or are planning to become pregnant.  If you drink alcohol: ? Limit how much you have to 0-1 drink a day. ? Be aware of how much alcohol is in your drink. In the [place] one drink equals one 12 oz bottle of beer (355 mL), one 5 oz glass of wine (148 mL), or one 1 oz glass of hard liquor (44 mL). Lifestyle  Take daily care of your teeth and gums.  Stay active. Exercise for at least 30 minutes on 5 or more days each week.  Do not use any products that contain nicotine or tobacco, such as cigarettes, e-cigarettes, and chewing tobacco. If you need help quitting, ask your health care provider.  If you are sexually active, practice safe sex. Use a condom or other form of birth control (contraception) in order to prevent pregnancy and STIs (sexually transmitted infections). If you plan to become pregnant, see your health care provider for a preconception visit. What's next?  Visit your health care provider once a year for a well check visit.  Ask your health care provider how often you should have your eyes and teeth checked.  Stay up to date on all vaccines. This information is not intended to replace advice given to you by your health care provider. Make sure you discuss any questions you have with your health care provider. Document Revised: 10/04/2017 Document Reviewed: 10/04/2017 Elsevier Patient Education  2020 [employer].

## 2020-01-04 NOTE — Progress Notes (Signed)
Subjective:    Patient ID: Daisy Singleton, female    DOB: 1990/06/03, 29 y.o.   MRN: [id number]  HPI Chief Complaint  Patient presents with  . cpe    nonfasting cpe, having pain in right lower quandart when coughing. sees obgyn   She is here for a complete physical exam.  Other providers: Psychiatrist- Dr. Marlyne Beards and he is retiring  OB/GYN- Dr. Marcelle Overlie  ENT  Dermatologist  [facility]  Orthopedist- Dr. Jerl Santos  Anxiety and depression- on medication and followed by psychiatrist   Reports intermittent ongoing right lower quadrant pain that occurs mainly with sneezing or coughing but not always present at those times.  We discussed this last year.  Denies any increase in frequency or intensity.  No changes in bowel habits or urinary symptoms.  No nausea, vomiting, bloating, constipation or diarrhea.   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 Fruits and vegetables.  Excerise: more regular. She did a sprint triathlon recently   Immunizations: Flu shot November 07, 2019 at work and [employer] booster Moderna on 12/12/2019 Health maintenance:  Mammogram: and [place] last month due to cyst in her right axilla.  Colonoscopy: 2017 due to GI bleeding  Last Gynecological Exam: March 2021  Last Menstrual cycle: last month. Irregular since Nexplanon  Last Dental Exam: every 6 months  Last Eye Exam: [place] last year   Wears seatbelt always, uses sunscreen, smoke detectors in home and functioning, does not text while driving and feels safe in home environment.   Reviewed allergies, medications, past medical, surgical, family, and social history.   Review of Systems Review of Systems Constitutional: -fever, -chills, -sweats, -unexpected weight change,-fatigue ENT: -runny nose, -ear pain, -sore throat Cardiology:  -chest pain, -palpitations, -edema Respiratory: -cough,  -shortness of breath, -wheezing Gastroenterology: + Intermittent chronic RLQ abdominal pain, -nausea, -vomiting, -diarrhea, -constipation  Hematology: -bleeding or bruising problems Musculoskeletal: -arthralgias, -myalgias, -joint swelling, -back pain Ophthalmology: -vision changes Urology: -dysuria, -difficulty urinating, -hematuria, -urinary frequency, -urgency Neurology: -headache, -weakness, -tingling, -numbness       Objective:   Physical Exam BP 120/68   Pulse 66   Ht 5\' 2"  (1.575 m)   Wt 186 lb 3.2 oz (84.5 kg)   BMI 34.06 kg/m   General Appearance:    Alert, cooperative, no distress, appears stated age  Head:    Normocephalic, without obvious abnormality, atraumatic  Eyes:    PERRL, conjunctiva/corneas clear, EOM's intact  Ears:    Normal TM's and external ear canals  Nose:  Mask on  Throat:  Mask on  Neck:   Supple, no lymphadenopathy;  thyroid:  no   enlargement/tenderness/nodules; no JVD  Back:    Spine nontender, no curvature, ROM normal, no CVA     tenderness  Lungs:     Clear to auscultation bilaterally without wheezes, rales or     ronchi; respirations unlabored  Chest Wall:    No tenderness or deformity   Heart:    Regular rate and rhythm, S1 and S2 normal, no murmur, rub   or gallop  Breast Exam:    OB/GYN   Abdomen:     Soft, non-tender, nondistended, normoactive bowel sounds,    no masses, no hepatosplenomegaly  Genitalia:    OB/GYN   Rectal:    Not performed due to age<40 and no related complaints  Extremities:   No clubbing, cyanosis or  edema  Pulses:   2+ and symmetric all extremities  Skin:   Skin color, texture, turgor normal, no rashes or lesions  Lymph nodes:   Cervical, supraclavicular, and axillary nodes normal  Neurologic:   CNII-XII intact, normal strength, sensation and gait; reflexes 2+ and symmetric throughout          Psych:   Normal mood, affect, hygiene and grooming.         Assessment & Plan:  Routine general medical examination at a  health care facility - Plan: CBC with Differential/Platelet, Comprehensive metabolic panel, POCT Urinalysis DIP (Proadvantage Device), TSH, T4, free, T3, Lipid panel, CANCELED: CBC with Differential/Platelet, CANCELED: Comprehensive metabolic panel, CANCELED: POCT Urinalysis DIP (Proadvantage Device), CANCELED: TSH, CANCELED: T4, free, CANCELED: Lipid panel -He is here today for her CPE but she is not fasting.  She will return for labs. Preventive health care reviewed.  She sees OB/GYN.  She also has other specialists including dermatology, urology and psychiatrist.  Counseling on healthy lifestyle including diet and exercise.  She seems to be taking good care of herself. Immunizations reviewed and she is up-to-date. Discussed safety and health promotion.  Need for hepatitis C screening test - Plan: Hepatitis C antibody, CANCELED: Hepatitis C antibody -Done per screening guidelines  Elevated LDL cholesterol level - Plan: Lipid panel, CANCELED: Lipid panel -Recommend low-fat, low-cholesterol diet and getting adequate exercise.  I will follow-up pending fasting lipid panel  Anxiety and depression -Followed by psychiatrist.  Intermittent right lower quadrant abdominal pain - Plan: POCT Urinalysis DIP (Proadvantage Device), CANCELED: POCT Urinalysis DIP (Proadvantage Device) -Ongoing for more than 1 year without increasing in frequency or intensity.  Abdominal exam benign.  Suspect this may be related to musculoskeletal or scar tissue.  She will let me know if she develops any new or worsening symptoms.  Obesity (BMI 30-39.9) - Plan: TSH, T4, free, T3, Lipid panel, CANCELED: TSH, CANCELED: T4, free, CANCELED: Lipid panel -She is aware of her BMI and working on weight loss.  Overall her diet is reportedly pretty healthy.  She will get back to exercising once her leg is healed.  Hematuria- asymptomatic. cycle starting very soon per patient. Will need follow up

## 2020-01-05 ENCOUNTER — Encounter: Payer: Self-pay | Admitting: Family Medicine

## 2020-01-05 ENCOUNTER — Other Ambulatory Visit: Payer: Self-pay

## 2020-01-05 ENCOUNTER — Ambulatory Visit: Payer: BC Managed Care – PPO | Admitting: Family Medicine

## 2020-01-05 VITALS — BP 120/68 | HR 66 | Ht 62.0 in | Wt 186.2 lb

## 2020-01-05 DIAGNOSIS — E669 Obesity, unspecified: Secondary | ICD-10-CM | POA: Insufficient documentation

## 2020-01-05 DIAGNOSIS — E7800 Pure hypercholesterolemia, unspecified: Secondary | ICD-10-CM

## 2020-01-05 DIAGNOSIS — R319 Hematuria, unspecified: Secondary | ICD-10-CM | POA: Insufficient documentation

## 2020-01-05 DIAGNOSIS — R1031 Right lower quadrant pain: Secondary | ICD-10-CM | POA: Insufficient documentation

## 2020-01-05 DIAGNOSIS — Z1159 Encounter for screening for other viral diseases: Secondary | ICD-10-CM | POA: Diagnosis not present

## 2020-01-05 DIAGNOSIS — F419 Anxiety disorder, unspecified: Secondary | ICD-10-CM | POA: Diagnosis not present

## 2020-01-05 DIAGNOSIS — Z0000 Encounter for general adult medical examination without abnormal findings: Secondary | ICD-10-CM | POA: Diagnosis not present

## 2020-01-05 DIAGNOSIS — F32A Depression, unspecified: Secondary | ICD-10-CM | POA: Insufficient documentation

## 2020-01-05 LAB — POCT URINALYSIS DIP (PROADVANTAGE DEVICE)
Bilirubin, UA: NEGATIVE
Glucose, UA: NEGATIVE mg/dL
Ketones, POC UA: NEGATIVE mg/dL
Leukocytes, UA: NEGATIVE
Nitrite, UA: NEGATIVE
Protein Ur, POC: NEGATIVE mg/dL
Specific Gravity, Urine: 1.02
Urobilinogen, Ur: NEGATIVE
pH, UA: 6.5 (ref 5.0–8.0)

## 2020-01-06 ENCOUNTER — Other Ambulatory Visit: Payer: BC Managed Care – PPO

## 2020-01-06 DIAGNOSIS — Z1159 Encounter for screening for other viral diseases: Secondary | ICD-10-CM

## 2020-01-06 DIAGNOSIS — E7800 Pure hypercholesterolemia, unspecified: Secondary | ICD-10-CM

## 2020-01-06 DIAGNOSIS — E669 Obesity, unspecified: Secondary | ICD-10-CM

## 2020-01-06 DIAGNOSIS — Z0000 Encounter for general adult medical examination without abnormal findings: Secondary | ICD-10-CM

## 2020-01-07 LAB — CBC WITH DIFFERENTIAL/PLATELET
Basophils Absolute: 0.1 10*3/uL (ref 0.0–0.2)
Basos: 1 %
EOS (ABSOLUTE): 0.1 10*3/uL (ref 0.0–0.4)
Eos: 1 %
Hematocrit: 41 % (ref 34.0–46.6)
Hemoglobin: 13.4 g/dL (ref 11.1–15.9)
Immature Grans (Abs): 0 10*3/uL (ref 0.0–0.1)
Immature Granulocytes: 0 %
Lymphocytes Absolute: 2 10*3/uL (ref 0.7–3.1)
Lymphs: 34 %
MCH: 28.8 pg (ref 26.6–33.0)
MCHC: 32.7 g/dL (ref 31.5–35.7)
MCV: 88 fL (ref 79–97)
Monocytes Absolute: 0.5 10*3/uL (ref 0.1–0.9)
Monocytes: 8 %
Neutrophils Absolute: 3.4 10*3/uL (ref 1.4–7.0)
Neutrophils: 56 %
Platelets: 247 10*3/uL (ref 150–450)
RBC: 4.66 x10E6/uL (ref 3.77–5.28)
RDW: 13 % (ref 11.7–15.4)
WBC: 6.1 10*3/uL (ref 3.4–10.8)

## 2020-01-07 LAB — COMPREHENSIVE METABOLIC PANEL
ALT: 18 IU/L (ref 0–32)
AST: 15 IU/L (ref 0–40)
Albumin/Globulin Ratio: 1.9 (ref 1.2–2.2)
Albumin: 4.1 g/dL (ref 3.8–4.8)
Alkaline Phosphatase: 21 IU/L — ABNORMAL LOW (ref 44–121)
BUN/Creatinine Ratio: 12 (ref 9–23)
BUN: 10 mg/dL (ref 6–20)
Bilirubin Total: 0.4 mg/dL (ref 0.0–1.2)
CO2: 19 mmol/L — ABNORMAL LOW (ref 20–29)
Calcium: 8.6 mg/dL — ABNORMAL LOW (ref 8.7–10.2)
Chloride: 107 mmol/L — ABNORMAL HIGH (ref 96–106)
Creatinine, Ser: 0.83 mg/dL (ref 0.57–1.00)
GFR calc Af Amer: 109 mL/min/{1.73_m2} (ref >59)
GFR calc non Af Amer: 94 mL/min/{1.73_m2} (ref >59)
Globulin, Total: 2.2 g/dL (ref 1.5–4.5)
Glucose: 87 mg/dL (ref 65–99)
Potassium: 4.5 mmol/L (ref 3.5–5.2)
Sodium: 137 mmol/L (ref 134–144)
Total Protein: 6.3 g/dL (ref 6.0–8.5)

## 2020-01-07 LAB — TSH: TSH: 1.42 u[IU]/mL (ref 0.450–4.500)

## 2020-01-07 LAB — LIPID PANEL
Chol/HDL Ratio: 4.6 ratio — ABNORMAL HIGH (ref 0.0–4.4)
Cholesterol, Total: 226 mg/dL — ABNORMAL HIGH (ref 100–199)
HDL: 49 mg/dL (ref >39)
LDL Chol Calc (NIH): 151 mg/dL — ABNORMAL HIGH (ref 0–99)
Triglycerides: 143 mg/dL (ref 0–149)
VLDL Cholesterol Cal: 26 mg/dL (ref 5–40)

## 2020-01-07 LAB — T4, FREE: Free T4: 1.08 ng/dL (ref 0.82–1.77)

## 2020-01-07 LAB — HEPATITIS C ANTIBODY: Hep C Virus Ab: 0.2 s/co ratio (ref 0.0–0.9)

## 2020-01-07 LAB — T3: T3, Total: 101 ng/dL (ref 71–180)

## 2020-01-15 ENCOUNTER — Telehealth: Payer: Self-pay | Admitting: Family Medicine

## 2020-01-15 NOTE — Telephone Encounter (Signed)
Requested records received from [facility] °

## 2020-01-22 ENCOUNTER — Encounter: Payer: Self-pay | Admitting: Internal Medicine

## 2020-01-23 ENCOUNTER — Encounter: Payer: Self-pay | Admitting: Family Medicine

## 2020-03-30 ENCOUNTER — Ambulatory Visit: Payer: BC Managed Care – PPO | Admitting: Family Medicine

## 2020-04-01 ENCOUNTER — Ambulatory Visit: Payer: BC Managed Care – PPO | Admitting: Family Medicine

## 2020-04-01 ENCOUNTER — Other Ambulatory Visit: Payer: Self-pay

## 2020-04-01 ENCOUNTER — Encounter: Payer: Self-pay | Admitting: Family Medicine

## 2020-04-01 VITALS — BP 110/70 | HR 71 | Temp 98.0°F | Wt 192.8 lb

## 2020-04-01 DIAGNOSIS — R52 Pain, unspecified: Secondary | ICD-10-CM | POA: Diagnosis not present

## 2020-04-01 DIAGNOSIS — R1030 Lower abdominal pain, unspecified: Secondary | ICD-10-CM

## 2020-04-01 DIAGNOSIS — N926 Irregular menstruation, unspecified: Secondary | ICD-10-CM

## 2020-04-01 DIAGNOSIS — R110 Nausea: Secondary | ICD-10-CM | POA: Diagnosis not present

## 2020-04-01 DIAGNOSIS — R5383 Other fatigue: Secondary | ICD-10-CM | POA: Diagnosis not present

## 2020-04-01 DIAGNOSIS — R519 Headache, unspecified: Secondary | ICD-10-CM | POA: Diagnosis not present

## 2020-04-01 DIAGNOSIS — Z862 Personal history of diseases of the blood and blood-forming organs and certain disorders involving the immune mechanism: Secondary | ICD-10-CM

## 2020-04-01 LAB — POCT URINALYSIS DIP (PROADVANTAGE DEVICE)
Bilirubin, UA: NEGATIVE
Blood, UA: NEGATIVE
Glucose, UA: NEGATIVE mg/dL
Ketones, POC UA: NEGATIVE mg/dL
Leukocytes, UA: NEGATIVE
Nitrite, UA: NEGATIVE
Protein Ur, POC: NEGATIVE mg/dL
Specific Gravity, Urine: 1.02
Urobilinogen, Ur: NEGATIVE
pH, UA: 6 (ref 5.0–8.0)

## 2020-04-01 LAB — POCT URINE PREGNANCY: Preg Test, Ur: NEGATIVE

## 2020-04-01 LAB — POC COVID19 BINAXNOW: SARS Coronavirus 2 Ag: NEGATIVE

## 2020-04-01 NOTE — Progress Notes (Signed)
   Subjective:    Patient ID: Daisy Singleton, female    DOB: [birth year], 30 y.o.   MRN: [id number]  HPI Chief Complaint  Patient presents with  . Fatigue    Fatigue that is getting worse. Going on 3 weeks   She is here with complaints of fatigue. Acute onset 3 weeks ago.   States she has been very sleeping and fell asleep at her desk at work on Monday.    She also reports having muscle aches for the past 4-5 days.   States she has been eating more to help stay awake. She has had nausea and vomiting. Feeling more short of breath with activity than normal.   Complains of headache and lower abdominal pain for the past week. No urinary symptoms.   No fever, chills, dizziness, chest pain, palpitations, diarrhea.   Covid vaccines- received all 3.   She has underlying allergies.   Taking a Multi-vitamin.   Last labs in November and no anemia.   Other providers: Psychiatrist- Dr. Marlyne Beards and he is retiring  OB/GYN- Dr. Marcelle Overlie  ENT  Dermatologist  [facility]  Orthopedist- Dr. Jerl Santos  Anxiety and depression- on medication and followed by psychiatrist   LMP: skipped this month. Has had negative home pregnancy tests.  Nexplanon in place.   Reviewed allergies, medications, past medical, surgical, family, and social history.     Review of Systems Pertinent positives and negatives in the history of present illness.     Objective:   Physical Exam BP 110/70   Pulse 71   Temp 98 F (36.7 C)   Wt 192 lb 12.8 oz (87.5 kg)   SpO2 96%   BMI 35.26 kg/m   Alert and oriented and in no distress. Neck is supple without adenopathy or thyromegaly. Cardiac exam shows a regular  rhythm without murmurs or gallops. Lungs are clear to auscultation. Abdomen is soft, non distended, normal BS, TTP to bilateral lower abdomen without rebound or guarding. Extremities without edema. Skin is warm and dry. Normal speech, mood and memory. PERRLA, EOMs intact. CNs intact.        Assessment & Plan:  Fatigue, unspecified type - Plan: CBC with Differential/Platelet, Comprehensive metabolic panel, POC COVID-19 BinaxNow, Novel Coronavirus, NAA ([facility]), TSH, T4, free, T3, VITAMIN D 25 Hydroxy (Vit-D Deficiency, Fractures), Vitamin B12, Iron, TIBC and Ferritin Panel, POCT urine pregnancy, Novel Coronavirus, NAA ([facility])  Body aches - Plan: POC COVID-19 BinaxNow, Novel Coronavirus, NAA ([facility]), Novel Coronavirus, NAA ([facility])  Acute nonintractable headache, unspecified headache type - Plan: POC COVID-19 BinaxNow, Novel Coronavirus, NAA ([facility]), Novel Coronavirus, NAA ([facility])  Nausea - Plan: POC COVID-19 BinaxNow, Novel Coronavirus, NAA ([facility]), POCT urine pregnancy, Novel Coronavirus, NAA ([facility])  Missed period - Plan: POCT urine pregnancy  Lower abdominal pain - Plan: POCT urine pregnancy, POCT Urinalysis DIP (Proadvantage Device)  History of anemia - Plan: CBC with Differential/Platelet, Iron, TIBC and Ferritin Panel  UA negative.  UPT negative  Rapid Covid negative  PCR sent.  Discussed that her symptoms are somewhat suspicious for Covid so I will check for this.  Recommend supportive care.  Declines Zofran prescription.  Abdominal exam is benign.  Check labs and follow up. She will let me know if she has any new or worsening symptoms.

## 2020-04-02 ENCOUNTER — Encounter: Payer: Self-pay | Admitting: Family Medicine

## 2020-04-02 ENCOUNTER — Other Ambulatory Visit: Payer: Self-pay | Admitting: Family Medicine

## 2020-04-02 DIAGNOSIS — E538 Deficiency of other specified B group vitamins: Secondary | ICD-10-CM

## 2020-04-02 DIAGNOSIS — E559 Vitamin D deficiency, unspecified: Secondary | ICD-10-CM

## 2020-04-02 LAB — IRON,TIBC AND FERRITIN PANEL
Ferritin: 128 ng/mL (ref 15–150)
Iron Saturation: 38 % (ref 15–55)
Iron: 114 ug/dL (ref 27–159)
Total Iron Binding Capacity: 298 ug/dL (ref 250–450)
UIBC: 184 ug/dL (ref 131–425)

## 2020-04-02 LAB — COMPREHENSIVE METABOLIC PANEL
ALT: 14 IU/L (ref 0–32)
AST: 13 IU/L (ref 0–40)
Albumin/Globulin Ratio: 1.8 (ref 1.2–2.2)
Albumin: 4.4 g/dL (ref 3.8–4.8)
Alkaline Phosphatase: 20 IU/L — ABNORMAL LOW (ref 44–121)
BUN/Creatinine Ratio: 12 (ref 9–23)
BUN: 9 mg/dL (ref 6–20)
Bilirubin Total: 0.3 mg/dL (ref 0.0–1.2)
CO2: 17 mmol/L — ABNORMAL LOW (ref 20–29)
Calcium: 8.8 mg/dL (ref 8.7–10.2)
Chloride: 104 mmol/L (ref 96–106)
Creatinine, Ser: 0.77 mg/dL (ref 0.57–1.00)
GFR calc Af Amer: 118 mL/min/{1.73_m2} (ref >59)
GFR calc non Af Amer: 102 mL/min/{1.73_m2} (ref >59)
Globulin, Total: 2.5 g/dL (ref 1.5–4.5)
Glucose: 96 mg/dL (ref 65–99)
Potassium: 4.3 mmol/L (ref 3.5–5.2)
Sodium: 137 mmol/L (ref 134–144)
Total Protein: 6.9 g/dL (ref 6.0–8.5)

## 2020-04-02 LAB — CBC WITH DIFFERENTIAL/PLATELET
Basophils Absolute: 0.1 10*3/uL (ref 0.0–0.2)
Basos: 1 %
EOS (ABSOLUTE): 0 10*3/uL (ref 0.0–0.4)
Eos: 1 %
Hematocrit: 39.9 % (ref 34.0–46.6)
Hemoglobin: 13.3 g/dL (ref 11.1–15.9)
Immature Grans (Abs): 0 10*3/uL (ref 0.0–0.1)
Immature Granulocytes: 0 %
Lymphocytes Absolute: 1.7 10*3/uL (ref 0.7–3.1)
Lymphs: 31 %
MCH: 29.3 pg (ref 26.6–33.0)
MCHC: 33.3 g/dL (ref 31.5–35.7)
MCV: 88 fL (ref 79–97)
Monocytes Absolute: 0.4 10*3/uL (ref 0.1–0.9)
Monocytes: 8 %
Neutrophils Absolute: 3.1 10*3/uL (ref 1.4–7.0)
Neutrophils: 59 %
Platelets: 278 10*3/uL (ref 150–450)
RBC: 4.54 x10E6/uL (ref 3.77–5.28)
RDW: 12.7 % (ref 11.7–15.4)
WBC: 5.3 10*3/uL (ref 3.4–10.8)

## 2020-04-02 LAB — TSH: TSH: 1.22 u[IU]/mL (ref 0.450–4.500)

## 2020-04-02 LAB — T4, FREE: Free T4: 0.98 ng/dL (ref 0.82–1.77)

## 2020-04-02 LAB — VITAMIN B12: Vitamin B-12: 227 pg/mL — ABNORMAL LOW (ref 232–1245)

## 2020-04-02 LAB — SARS-COV-2, NAA 2 DAY TAT

## 2020-04-02 LAB — T3: T3, Total: 97 ng/dL (ref 71–180)

## 2020-04-02 LAB — NOVEL CORONAVIRUS, NAA: SARS-CoV-2, NAA: NOT DETECTED

## 2020-04-02 LAB — VITAMIN D 25 HYDROXY (VIT D DEFICIENCY, FRACTURES): Vit D, 25-Hydroxy: 15.8 ng/mL — ABNORMAL LOW (ref 30.0–100.0)

## 2020-04-02 MED ORDER — VITAMIN D (ERGOCALCIFEROL) 1.25 MG (50000 UNIT) PO CAPS: 50000.0000 [IU] | ORAL_CAPSULE | ORAL | 0 refills | Status: DC

## 2020-04-02 NOTE — Progress Notes (Signed)
Please call and let her know that I sent her a lengthy MyChart message.  Basically she needs to start taking a prescription vitamin D once weekly for the next 12 weeks and I will send a prescription to her pharmacy.  Also, her vitamin B-12 is low.  I would like for her to start taking an over-the-counter B12 supplement 1000 mcg/day should be fine.  Schedule her for a lab check for the B12 in 4 weeks please.  We do not need to check vitamin D at that time.

## 2020-04-26 ENCOUNTER — Ambulatory Visit: Payer: BC Managed Care – PPO | Admitting: Family Medicine

## 2020-04-26 ENCOUNTER — Encounter: Payer: Self-pay | Admitting: Psychiatry

## 2020-04-26 ENCOUNTER — Ambulatory Visit (INDEPENDENT_AMBULATORY_CARE_PROVIDER_SITE_OTHER): Payer: BC Managed Care – PPO | Admitting: Psychiatry

## 2020-04-26 ENCOUNTER — Other Ambulatory Visit: Payer: Self-pay

## 2020-04-26 DIAGNOSIS — F410 Panic disorder [episodic paroxysmal anxiety] without agoraphobia: Secondary | ICD-10-CM | POA: Diagnosis not present

## 2020-04-26 DIAGNOSIS — F411 Generalized anxiety disorder: Secondary | ICD-10-CM

## 2020-04-26 MED ORDER — FLUVOXAMINE MALEATE 100 MG PO TABS: 100.0000 mg | ORAL_TABLET | Freq: Every day | ORAL | 1 refills | Status: DC

## 2020-04-26 MED ORDER — LORAZEPAM 0.5 MG PO TABS: 0.5000 mg | ORAL_TABLET | Freq: Two times a day (BID) | ORAL | 2 refills | Status: DC | PRN

## 2020-04-26 NOTE — Progress Notes (Signed)
Daisy Singleton [id number] [birth year] 30 y.o.  Subjective:   Patient ID:  Daisy Singleton is a 30 y.o. (DOB [birth year]) female.  Chief Complaint:  Chief Complaint  Patient presents with  . Follow-up    H/o Anxiety, depression    HPI Daisy Singleton presents to the office today for follow-up of anxiety and depression. Pt previously seen by Dr. Marlyne Beards and care is being transferred to this provider due to Dr. Marlyne Beards' retirement. She reports that she is "really good." She reports that she initially had insomnia with fluvoxamine. She is now taking Lorazepam every night and is sleeping well. She reports that she used to take Lorazepam only prn panic.  Reports that she had one severe panic on one occasion in the last 6-8 weeks. Has been back into her hobbies and interests. She has noticed some increased spending and attributes this to having more disposable income. She reports that spending is not excessive. She reports that she has had some rumination. Has had intrusive thoughts in the past and none recently. She reports that she has times where she is excessively planning about a greenhouse project, even when she is supposed to be working on other things. She reports that her husband commented that this is consistent with her baseline. Energy and motivation have been good. Has had episodic depression in the past. Denies current depressed mood. Appetite has been good. She reports that she has had some wt gain and is unsure if this is med related and that benefits outweigh risks. She reports that she has had changes in physical activity and diet. Did a triathalon in the fall and now is doing yoga. She reports that she is in meditation. Concentration has been "fine." Denies impulsive or risky behavior. Denies SI.   Husband had heart failure a few years ago. She reports that husband's cardiac health has improved. Saw a therapist at that time.   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   Past Psychiatric Medication Trials: Luvox Fluoxetine Cymbalta Lorazepam  PHQ2-9   Flowsheet Row Office Visit from 01/05/2020 in [facility] Visit from 12/04/2018 in [facility] Visit from 08/17/2017 in [facility] Visit from 02/11/2015 in Primary Care at [facility] Visit from 02/05/2015 in Primary Care at [facility] Total Score 0 0 0 0 0       Review of Systems:  Review of Systems  Gastrointestinal: Negative.   Musculoskeletal: Negative for gait problem.  Neurological: Negative for tremors.  Psychiatric/Behavioral:       Please refer to HPI    Medications: I have reviewed the patient's current medications.  Current Outpatient Medications  Medication Sig Dispense Refill  . Clobetasol Propionate 0.05 % shampoo     . Cyanocobalamin (VITAMIN B 12) 500 MCG TABS Take 1,000 mcg by mouth.    . Etonogestrel (NEXPLANON [place]) Inject into the skin.    Marland Kitchen PREVIDENT 5000 BOOSTER PLUS 1.1 % PSTE toothpaste  0  . Vitamin D, Ergocalciferol, (DRISDOL) 1.25 MG (50000 UNIT) CAPS capsule Take 1 capsule (50,000 Units total) by mouth every 7 (seven) days. 12 capsule 0  . diclofenac (VOLTAREN) 50 MG EC tablet Take 50 mg by mouth 2 (two) times daily. (Patient not taking: Reported on 04/26/2020)    . fluvoxaMINE (LUVOX) 100 MG tablet Take 1 tablet (100 mg total) by mouth at bedtime. 90 tablet 1  . LORazepam (ATIVAN) 0.5 MG tablet Take 1 tablet (0.5 mg total) by  mouth 2 (two) times daily as needed for anxiety (Panic). 60 tablet 2   No current facility-administered medications for this visit.    Medication Side Effects: Other: Possible wt gain  Allergies:  Allergies  Allergen Reactions  . Latex     Past Medical History:  Diagnosis Date  . Allergy   . Anxiety and depression   . Asthma   . Dyspareunia, female   . Elevated LDL cholesterol level   . History of DVT of lower extremity   . History of HPV infection    LEEP in  the past  . Interstitial cystitis   . Psoriasis   . Seasonal allergies   . Vitamin D deficiency     Family History  Problem Relation Age of Onset  . Hyperlipidemia Mother   . Hypertension Mother   . Hyperlipidemia Father   . Hypertension Father   . Psoriasis Father   . Cleft lip Sister   . Cleft palate Sister   . Hypertension Sister   . Hyperlipidemia Sister     Social History   Socioeconomic History  . Marital status: Married    Spouse name: Not on file  . Number of children: Not on file  . Years of education: Not on file  . Highest education level: Not on file  Occupational History  . Not on file  Tobacco Use  . Smoking status: Never Smoker  . Smokeless tobacco: Never Used  Vaping Use  . Vaping Use: Never used  Substance and Sexual Activity  . Alcohol use: Yes    Comment: 1 drink 3-4 nights per week   . Drug use: No  . Sexual activity: Yes    Partners: Male    Birth control/protection: Implant  Other Topics Concern  . Not on file  Social History Narrative  . Not on file   Social Determinants of Health   Financial Resource Strain: Not on file  Food Insecurity: Not on file  Transportation Needs: Not on file  Physical Activity: Not on file  Stress: Not on file  Social Connections: Not on file  Intimate Partner Violence: Not on file    Past Medical History, Surgical history, Social history, and Family history were reviewed and updated as appropriate.   Please see review of systems for further details on the patient's review from today.   Objective:   Physical Exam:  There were no vitals taken for this visit.  Physical Exam Constitutional:      General: She is not in acute distress. Musculoskeletal:        General: No deformity.  Neurological:     Mental Status: She is alert and oriented to person, place, and time.     Coordination: Coordination normal.  Psychiatric:        Attention and Perception: Attention and perception normal. She does not  perceive auditory or visual hallucinations.        Mood and Affect: Mood normal. Mood is not anxious or depressed. Affect is not labile, blunt, angry or inappropriate.        Speech: Speech normal.        Behavior: Behavior normal.        Thought Content: Thought content normal. Thought content is not paranoid or delusional. Thought content does not include homicidal or suicidal ideation. Thought content does not include homicidal or suicidal plan.        Cognition and Memory: Cognition and memory normal.        Judgment:  Judgment normal.     Comments: Insight intact     Lab Review:     Component Value Date/Time   NA 137 04/01/2020 0919   K 4.3 04/01/2020 0919   CL 104 04/01/2020 0919   CO2 17 (L) 04/01/2020 0919   GLUCOSE 96 04/01/2020 0919   GLUCOSE 105 (H) 09/25/2013 1626   BUN 9 04/01/2020 0919   CREATININE 0.77 04/01/2020 0919   CREATININE 0.80 09/25/2013 1626   CALCIUM 8.8 04/01/2020 0919   PROT 6.9 04/01/2020 0919   ALBUMIN 4.4 04/01/2020 0919   AST 13 04/01/2020 0919   ALT 14 04/01/2020 0919   ALKPHOS 20 (L) 04/01/2020 0919   BILITOT 0.3 04/01/2020 0919   GFRNONAA 102 04/01/2020 0919   GFRAA 118 04/01/2020 0919       Component Value Date/Time   WBC 5.3 04/01/2020 0919   WBC 7.0 09/25/2013 1643   RBC 4.54 04/01/2020 0919   RBC 4.78 09/25/2013 1643   HGB 13.3 04/01/2020 0919   HCT 39.9 04/01/2020 0919   PLT 278 04/01/2020 0919   MCV 88 04/01/2020 0919   MCH 29.3 04/01/2020 0919   MCH 28.8 09/25/2013 1643   MCHC 33.3 04/01/2020 0919   MCHC 32.8 09/25/2013 1643   RDW 12.7 04/01/2020 0919   LYMPHSABS 1.7 04/01/2020 0919   EOSABS 0.0 04/01/2020 0919   BASOSABS 0.1 04/01/2020 0919    No results found for: POCLITH, LITHIUM   No results found for: PHENYTOIN, PHENOBARB, VALPROATE, CBMZ   .res Assessment: Plan:   Patient seen for 40 minutes and time spent reviewing history and discussing patient's questions about long-term use of medications and use of  lorazepam as needed for severe panic.  Discussed patient could use lorazepam during the day as needed for anxiety, such as taking a lower amount in anticipation of a stressor or taking a higher amount on the rare occasions that she has severe panic. Will continue fluvoxamine 100 mg at bedtime for anxiety and mood.  She reports that she may be experiencing some weight gain with clovoxamine and at this time benefits are outweighing potential side effects. Continue lorazepam 0.5 mg twice daily as needed for anxiety and insomnia. Patient to follow-up in 6 months or sooner if clinically indicated. Patient advised to contact office with any questions, adverse effects, or acute worsening in signs and symptoms.  Daisy Singleton was seen today for follow-up.  Diagnoses and all orders for this visit:  Panic disorder -     LORazepam (ATIVAN) 0.5 MG tablet; Take 1 tablet (0.5 mg total) by mouth 2 (two) times daily as needed for anxiety (Panic). -     fluvoxaMINE (LUVOX) 100 MG tablet; Take 1 tablet (100 mg total) by mouth at bedtime.  Generalized anxiety disorder -     LORazepam (ATIVAN) 0.5 MG tablet; Take 1 tablet (0.5 mg total) by mouth 2 (two) times daily as needed for anxiety (Panic). -     fluvoxaMINE (LUVOX) 100 MG tablet; Take 1 tablet (100 mg total) by mouth at bedtime.     Please see After Visit Summary for patient specific instructions.  Future Appointments  Date Time Provider Department Center  05/03/2020  8:45 AM [facility] NURSE [facility]  10/27/2020  8:30 AM Corie Chiquito, PMHNP [facility] None    No orders of the defined types were placed in this encounter.   -------------------------------

## 2020-05-03 ENCOUNTER — Other Ambulatory Visit: Payer: BC Managed Care – PPO

## 2020-05-28 ENCOUNTER — Other Ambulatory Visit: Payer: BC Managed Care – PPO

## 2020-05-28 ENCOUNTER — Other Ambulatory Visit: Payer: Self-pay

## 2020-05-28 DIAGNOSIS — E538 Deficiency of other specified B group vitamins: Secondary | ICD-10-CM

## 2020-05-29 LAB — VITAMIN B12: Vitamin B-12: 527 pg/mL (ref 232–1245)

## 2020-06-24 ENCOUNTER — Encounter: Payer: Self-pay | Admitting: Family Medicine

## 2020-08-02 ENCOUNTER — Encounter: Payer: Self-pay | Admitting: Family Medicine

## 2020-09-07 ENCOUNTER — Other Ambulatory Visit: Payer: Self-pay

## 2020-09-07 ENCOUNTER — Encounter: Payer: Self-pay | Admitting: Family Medicine

## 2020-09-07 ENCOUNTER — Ambulatory Visit: Payer: BC Managed Care – PPO | Admitting: Family Medicine

## 2020-09-07 VITALS — Temp 96.7°F | Wt 192.0 lb

## 2020-09-07 DIAGNOSIS — U071 COVID-19: Secondary | ICD-10-CM | POA: Diagnosis not present

## 2020-09-07 NOTE — Progress Notes (Signed)
   Subjective:    Patient ID: Daisy Singleton, female    DOB: [birth year], 30 y.o.   MRN: [id number]  HPI Documentation for virtual audio and video telecommunications through [facility] encounter: The patient was located at home. 2 patient identifiers used.  The provider was located in the office. The patient did consent to this visit and is aware of possible charges through their insurance for this visit. The other persons participating in this telemedicine service were none. Time spent on call was 5 minutes and in review of previous records >20 minutes total for counseling and coordination of care. This virtual service is not related to other E/M service within previous 7 days.  She states that on Sunday she developed malaise and a slight fever but tested negative.  The next day she tested again and was positive.  She also has been having difficulty with fever, cough, sore throat, myalgias diarrhea and nasal congestion.  No smell or taste change.  She has had 3 COVID vaccines.  Review of Systems     Objective:   Physical Exam Alert and in no distress with a normal breathing pattern.       Assessment & Plan:  COVID-19 I discussed that she has a 5-day window that will end on Friday.  I will give her a note to be out of work for the rest of the week.  She is to treat her symptoms with Tylenol, Robitussin-DM and possibly NyQuil at night.  She does have diclofenac and can use that as well as the Tylenol.  Discussed worsening of her symptoms in terms of worsening fever, cough, shortness of breath.  Discussed the fact that she needs to wear a mask starting Saturday for the next 5 days.

## 2020-09-13 LAB — HM PAP SMEAR: HM Pap smear: NORMAL

## 2020-10-27 ENCOUNTER — Ambulatory Visit: Payer: BC Managed Care – PPO | Admitting: Psychiatry

## 2020-11-02 ENCOUNTER — Encounter: Payer: Self-pay | Admitting: Psychiatry

## 2020-11-02 ENCOUNTER — Ambulatory Visit: Payer: BC Managed Care – PPO | Admitting: Psychiatry

## 2020-11-02 ENCOUNTER — Other Ambulatory Visit: Payer: Self-pay

## 2020-11-02 DIAGNOSIS — F411 Generalized anxiety disorder: Secondary | ICD-10-CM

## 2020-11-02 DIAGNOSIS — F410 Panic disorder [episodic paroxysmal anxiety] without agoraphobia: Secondary | ICD-10-CM

## 2020-11-02 MED ORDER — FLUVOXAMINE MALEATE 100 MG PO TABS: 100.0000 mg | ORAL_TABLET | Freq: Every day | ORAL | 1 refills | Status: DC

## 2020-11-02 MED ORDER — LORAZEPAM 0.5 MG PO TABS: 0.5000 mg | ORAL_TABLET | Freq: Two times a day (BID) | ORAL | 2 refills | Status: DC | PRN

## 2020-11-02 NOTE — Progress Notes (Signed)
Clarity Ciszek [id number] 1990/05/01 30 y.o.  Subjective:   Patient ID:  Daisy Singleton is a 30 y.o. (DOB Sep 19, 1990) female.  Chief Complaint:  Chief Complaint  Patient presents with   Anxiety    Anxiety    Daisy Singleton presents to the office today for follow-up of anxiety. She reports that she broke her foot on 05/03/20 and 2 days later her husband got laid off. 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 She got her [employer] certification and started a new job in April. Had COVID in August and reports that she was very sick.   She reports that she has felt "a little more anxious the last couple of months." She reports that she has had "breakthrough anxiety" where she has some increased anxiety with rumination. Recently was ruminating about a job offer and woke up in the middle of the night thinking about it. She has been learning new tools with yoga training. She reports that she attributes increased anxiety to multiple stressors. She reports that she "put myself out there" in recent months into situations that would usually have triggered anxiety. Occ distractibility due to anxiety/rumination. She reports that she has had panic attacks in the past and has not had an uncontrollable panic attack in awhile.She reports that she has had some sad days "but not depression." Sleeping well. Husband has told her she is snoring more. Reports occ nightmares, ie. In a confrontation, etc and feels drained afterwards. 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 Denies difficulty falling or staying asleep. Appetite has been good. Has been trying to recognize when she is eating to calm herself. Energy and motivation have been ok. Concentration has been adequate. Denies SI.   She reports that her husband's cardiac health has been stable, however he is now having some GI issues and making some improvements.  Husband's health seems to be improving.   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 Reports coworkers are supportive.   Past Psychiatric Medication Trials: Luvox Fluoxetine Cymbalta Lorazepam  PHQ2-9    Flowsheet Row Office Visit from 01/05/2020 in [facility] Visit from 12/04/2018 in [facility] Visit from 08/17/2017 in [facility] Visit from 02/11/2015 in Primary Care at [facility] Visit from 02/05/2015 in Primary Care at [facility] Total Score 0 0 0 0 0        Review of Systems:  Review of Systems  Gastrointestinal: Negative.   Musculoskeletal:  Negative for gait problem.       Foot fracture in the spring  Neurological:  Negative for tremors and headaches.  Psychiatric/Behavioral:         Please refer to HPI   Medications: I have reviewed the patient's current medications.  Current Outpatient Medications  Medication Sig Dispense Refill   Clobetasol Propionate 0.05 % shampoo      Cyanocobalamin (VITAMIN B 12) 500 MCG TABS Take 1,000 mcg by mouth.     diclofenac (VOLTAREN) 50 MG EC tablet Take 50 mg by mouth 2 (two) times daily as needed.     Etonogestrel (NEXPLANON [place]) Inject into the skin.     levonorgestrel (KYLEENA) 19.5 MG IUD [place] 17.5 mcg/24 hrs (66yrs) 19.5mg  intrauterine device  [facility]     PREVIDENT 5000 BOOSTER PLUS 1.1 % PSTE toothpaste  0   VITAMIN D PO Take by mouth.     fluvoxaMINE (LUVOX) 100 MG tablet Take 1 tablet (100 mg total) by mouth at bedtime. 90 tablet 1   LORazepam (ATIVAN) 0.5 MG tablet Take 1 tablet (0.5 mg total) by mouth 2 (two) times daily as needed for anxiety (Panic). 60 tablet 2   Vitamin D, Ergocalciferol, (DRISDOL) 1.25 MG (50000 UNIT) CAPS capsule Take 1 capsule (50,000 Units total) by mouth every 7 (seven) days. (Patient not taking: No sig reported)  12 capsule 0   No current facility-administered medications for this visit.    Medication Side Effects: None  Allergies:  Allergies  Allergen Reactions   Cefdinir Diarrhea   Latex    Cortisone Hives and Other (See Comments)    Red flush    Past Medical History:  Diagnosis Date   Allergy    Anxiety and depression    Asthma    Dyspareunia, female    Elevated LDL cholesterol level    History of DVT of lower extremity    History of HPV infection    LEEP in the past   Interstitial cystitis    Psoriasis    Seasonal allergies    Vitamin D deficiency     Past Medical History, Surgical history, Social history, and Family history were reviewed and updated as appropriate.   Please see review of systems for further details on the patient's review from today.   Objective:   Physical Exam:  There were no vitals taken for this visit.  Physical Exam Constitutional:      General: She is not in acute distress. Musculoskeletal:        General: No deformity.  Neurological:     Mental Status: She is alert and oriented to person, place, and time.     Coordination: Coordination normal.  Psychiatric:        Attention and Perception: Attention and perception normal. She does not perceive auditory or visual hallucinations.        Mood and Affect: Mood is not depressed. Affect is not labile, blunt, angry or inappropriate.        Speech: Speech normal.        Behavior: Behavior normal.        Thought Content: Thought content normal. Thought content is not paranoid or delusional. Thought content does not include homicidal or suicidal ideation. Thought content does not include homicidal or suicidal plan.        Cognition and Memory: Cognition and memory normal.        Judgment: Judgment normal.     Comments: Insight intact Mood presents as mildly anxious    Lab Review:     Component Value Date/Time   NA 137 04/01/2020 0919   K 4.3 04/01/2020 0919   CL 104 04/01/2020 0919   CO2 17  (L) 04/01/2020 0919   GLUCOSE 96 04/01/2020 0919   GLUCOSE 105 (H) 09/25/2013 1626   BUN 9 04/01/2020 0919   CREATININE 0.77 04/01/2020 0919   CREATININE 0.80 09/25/2013 1626   CALCIUM 8.8 04/01/2020 0919   PROT 6.9 04/01/2020 0919   ALBUMIN 4.4 04/01/2020 0919   AST 13 04/01/2020 0919   ALT 14 04/01/2020 0919   ALKPHOS 20 (L) 04/01/2020 0919   BILITOT 0.3 04/01/2020 0919   GFRNONAA 102 04/01/2020 0919   GFRAA 118 04/01/2020 0919  Component Value Date/Time   WBC 5.3 04/01/2020 0919   WBC 7.0 09/25/2013 1643   RBC 4.54 04/01/2020 0919   RBC 4.78 09/25/2013 1643   HGB 13.3 04/01/2020 0919   HCT 39.9 04/01/2020 0919   PLT 278 04/01/2020 0919   MCV 88 04/01/2020 0919   MCH 29.3 04/01/2020 0919   MCH 28.8 09/25/2013 1643   MCHC 33.3 04/01/2020 0919   MCHC 32.8 09/25/2013 1643   RDW 12.7 04/01/2020 0919   LYMPHSABS 1.7 04/01/2020 0919   EOSABS 0.0 04/01/2020 0919   BASOSABS 0.1 04/01/2020 0919    No results found for: POCLITH, LITHIUM   No results found for: PHENYTOIN, PHENOBARB, VALPROATE, CBMZ   .res Assessment: Plan:   Pt seen for 30 minutes and time spent discussing potential benefits, risks, and side effects of increasing luvox and reviewing indications for luvox. She reports that she would like to continue Luvox 100 mg po QHS at this time since she attributes recent increased anxiety to multiple stressors and anticipates some improvement in stressors.  Continue Ativan 0.5 mg po BID prn anxiety.  Pt to follow-up in 6 months or sooner if clinically indicated.  Patient advised to contact office with any questions, adverse effects, or acute worsening in signs and symptoms.   Daisy Singleton was seen today for anxiety.  Diagnoses and all orders for this visit:  Panic disorder -     fluvoxaMINE (LUVOX) 100 MG tablet; Take 1 tablet (100 mg total) by mouth at bedtime. -     LORazepam (ATIVAN) 0.5 MG tablet; Take 1 tablet (0.5 mg total) by mouth 2 (two) times daily as needed  for anxiety (Panic).  Generalized anxiety disorder -     fluvoxaMINE (LUVOX) 100 MG tablet; Take 1 tablet (100 mg total) by mouth at bedtime. -     LORazepam (ATIVAN) 0.5 MG tablet; Take 1 tablet (0.5 mg total) by mouth 2 (two) times daily as needed for anxiety (Panic).    Please see After Visit Summary for patient specific instructions.  Future Appointments  Date Time Provider Department Center  05/02/2021  8:30 AM Corie Chiquito, PMHNP CP-CP None    No orders of the defined types were placed in this encounter.   -------------------------------

## 2020-12-08 ENCOUNTER — Other Ambulatory Visit: Payer: Self-pay

## 2020-12-08 ENCOUNTER — Ambulatory Visit: Payer: BC Managed Care – PPO | Admitting: Medical

## 2020-12-08 VITALS — BP 120/80 | HR 96 | Wt 203.6 lb

## 2020-12-08 DIAGNOSIS — R635 Abnormal weight gain: Secondary | ICD-10-CM | POA: Diagnosis not present

## 2020-12-08 DIAGNOSIS — F514 Sleep terrors [night terrors]: Secondary | ICD-10-CM

## 2020-12-08 DIAGNOSIS — R0683 Snoring: Secondary | ICD-10-CM | POA: Insufficient documentation

## 2020-12-08 NOTE — Progress Notes (Signed)
Subjective:  Daisy Singleton is a 30 y.o. female who presents for Chief Complaint  Patient presents with   discuss snoring.    Discuss snoring, husband has been noticing it more, worse since having covid, on a new medication that helps her sleep more soundly   Medical team: Psychiatry with [facility], was Dr. Beverly Milch before he recently retired Dr. Richarda Overlie, gyn Was seeing Hetty Blend, NP before she left recently ENT, Dr. Flo Shanks  Here for sleep concerns.  Husband says she snores.   Started taking vitamin D a while back and when using the D seems to feel rested.   But if not taking this, feels more tired.  Works 1st shift, no daytime somnolence.  Gets some headaches.   Had covid infection 08/2020, and thinks her snoring has worsened since then.   Now also on lorazepam now.  Was waking up with panic attacks and nightmares, but since being on Lorazepam, seems to be sleeping more sound.  Snoring may have worsened per husband since starting this daily.   She has been on lorazepam in the past just not daily.    Husband just got on CPAP, so he may be noticing her snoring now.  No prior sleep study.     Exercises with yoga, some swimming ,some biking.  Did a triathlon last year, but less athletics now.   Has gained 50lb maybe in the last 1.5 year though.  No other aggravating or relieving factors.    No other c/o.  Past Medical History:  Diagnosis Date   Allergy    Anxiety and depression    Asthma    Dyspareunia, female    Elevated LDL cholesterol level    History of DVT of lower extremity    History of HPV infection    LEEP in the past   Interstitial cystitis    Psoriasis    Seasonal allergies    Vitamin D deficiency    Current Outpatient Medications on File Prior to Visit  Medication Sig Dispense Refill   Clobetasol Propionate 0.05 % shampoo      fluvoxaMINE (LUVOX) 100 MG tablet Take 1 tablet (100 mg total) by mouth at bedtime. 90 tablet 1    levonorgestrel (KYLEENA) 19.5 MG IUD [place] 17.5 mcg/24 hrs (42yrs) 19.5mg  intrauterine device  [facility]     LORazepam (ATIVAN) 0.5 MG tablet Take 1 tablet (0.5 mg total) by mouth 2 (two) times daily as needed for anxiety (Panic). 60 tablet 2   PREVIDENT 5000 BOOSTER PLUS 1.1 % PSTE toothpaste  0   VITAMIN D PO Take by mouth.     No current facility-administered medications on file prior to visit.    The following portions of the patient's history were reviewed and updated as appropriate: allergies, current medications, past family history, past medical history, past social history, past surgical history and problem list.  ROS Otherwise as in subjective above  Objective: BP 120/80   Pulse 96   Wt 203 lb 9.6 oz (92.4 kg)   BMI 37.24 kg/m   General appearance: alert, no distress, well developed, well nourished Oral cavity: MMM, no lesions, no enlarged tonsils Neck: supple, no lymphadenopathy, no thyromegaly, no masses Heart: RRR, normal S1, S2, no murmurs Lungs: CTA bilaterally, no wheezes, rhonchi, or rales Pulses: 2+ radial pulses, 2+ pedal pulses, normal cap refill Ext: no edema Neuro: cn2-12 intact, nonfocal exam    Assessment: Encounter Diagnoses  Name Primary?   Snoring Yes  Night terror    Weight gain      Plan: We discussed her symptoms and concerns.  We discussed work on lifestyle changes and weight loss.  We discussed avoiding sleeping flat on her back.  She wants to continue her current medications as it has taken a long time to find a medicine regimen that really helps her night terrors and anxiety.  Discussed the effect of sedating medications and sleep.  Epworth screen 3.5  Referral to neurology for further evaluation and likely in lab sleep study.  Edward was seen today for discuss snoring..  Diagnoses and all orders for this visit:  Snoring -     Ambulatory referral to Neurology  Night terror -     Ambulatory referral to  Neurology  Weight gain   Follow up: pending neurology consult

## 2021-02-23 ENCOUNTER — Encounter: Payer: Self-pay | Admitting: Neurology

## 2021-02-23 ENCOUNTER — Ambulatory Visit: Payer: BC Managed Care – PPO | Admitting: Neurology

## 2021-02-23 ENCOUNTER — Other Ambulatory Visit: Payer: Self-pay

## 2021-02-23 VITALS — BP 119/72 | HR 79 | Ht 61.0 in | Wt 200.2 lb

## 2021-02-23 DIAGNOSIS — F515 Nightmare disorder: Secondary | ICD-10-CM

## 2021-02-23 DIAGNOSIS — Z9189 Other specified personal risk factors, not elsewhere classified: Secondary | ICD-10-CM | POA: Diagnosis not present

## 2021-02-23 DIAGNOSIS — R0683 Snoring: Secondary | ICD-10-CM

## 2021-02-23 DIAGNOSIS — R0681 Apnea, not elsewhere classified: Secondary | ICD-10-CM

## 2021-02-23 DIAGNOSIS — E669 Obesity, unspecified: Secondary | ICD-10-CM | POA: Diagnosis not present

## 2021-02-23 NOTE — Patient Instructions (Signed)

## 2021-02-23 NOTE — Progress Notes (Signed)
Subjective:    Patient ID: Daisy Singleton is a 31 y.o. female.  HPI   Huston Foley, MD, PhD [facility] [address], [address] [address] [place], [place] [address]  Dear Daisy Singleton,   I saw your patient, Daisy Singleton, upon your kind request in my sleep clinic today for initial consultation of her sleep disorder, in particular, concern for underlying obstructive sleep apnea.  The patient is unaccompanied today.  As you know, Daisy Singleton is a 31 year old right-handed woman with an underlying medical history of allergies, asthma, history of DVT, interstitial cystitis, psoriasis, vitamin D deficiency, anxiety, depression, and obesity, who reports snoring and sleep disruption, abnormal dreams. Her snoring has been worse since she had Covid in 08/22. She has had at least one occasion with witnessed pausing or shallow breathing and gurgling sound per husband's feedback.  She has no family history of sleep apnea.  Her weight has been fluctuating.  I reviewed your office note from 12/08/2020.  Her Epworth sleepiness score is 2 out of 24, fatigue severity score is 22 out of 63.  She lives with her husband, they have no children, they have 2 dogs in the household, dog sleep in a crate downstairs, not in the bedroom with them.  She has a TV in the bedroom but does not watch it at night.  She goes to bed around 9 and likes to read for at least half an hour.  Sometimes she will do a crossword puzzle on her phone.  She is typically asleep by 10:15 PM.  Rise time is 6:30 AM.  She denies night to night nocturia or recurrent morning headaches.  She works as an Public affairs consultant for [employer]  She does not drink caffeine daily.  She drinks alcohol about 2-3 times a week, 1 serving, she is a non-smoker.   She feels that she started having more vivid dreams when she started the Luvox.  She has been on Ativan as needed for several years but more consistently in the past year she has been  taking it nightly. Her husband has been diagnosed with sleep apnea and has a CPAP or AutoPap machine.  She would be willing to get tested and consider treatment even with a machine such as CPAP or AutoPap.  Her Past Medical History Is Significant For: Past Medical History:  Diagnosis Date   Allergy    Anxiety and depression    Asthma    Dyspareunia, female    Elevated LDL cholesterol level    History of DVT of lower extremity    History of HPV infection    LEEP in the past   Interstitial cystitis    Psoriasis    Seasonal allergies    Vitamin D deficiency     Her Past Surgical History Is Significant For: Past Surgical History:  Procedure Laterality Date   CERVICAL BIOPSY  W/ LOOP ELECTRODE EXCISION      Her Family History Is Significant For: Family History  Problem Relation Age of Onset   Hyperlipidemia Mother    Hypertension Mother    Hyperlipidemia Father    Hypertension Father    Psoriasis Father    Cleft lip Sister    Cleft palate Sister    Hypertension Sister    Hyperlipidemia Sister     Her Social History Is Significant For: Social History   Socioeconomic History   Marital status: Married    Spouse name: Not on file   Number of children: Not  on file   Years of education: Not on file   Highest education level: Not on file  Occupational History   Not on file  Tobacco Use   Smoking status: Never   Smokeless tobacco: Never  Vaping Use   Vaping Use: Never used  Substance and Sexual Activity   Alcohol use: Yes    Comment: 1 drink 3-4 nights per week    Drug use: No   Sexual activity: Yes    Partners: Male    Birth control/protection: Implant  Other Topics Concern   Not on file  Social History Narrative   Caffeine: occastional.  Education: masters.  Working: Field seismologist, married.  No kids.    Social Determinants of Health   Financial Resource Strain: Not on file  Food Insecurity: Not on file  Transportation Needs: Not on file  Physical Activity: Not on  file  Stress: Not on file  Social Connections: Not on file    Her Allergies Are:  Allergies  Allergen Reactions   Cefdinir Diarrhea   Latex    Cortisone Hives and Other (See Comments)    Red flush  :   Her Current Medications Are:  Outpatient Encounter Medications as of 02/23/2021  Medication Sig   Clobetasol Propionate 0.05 % shampoo As needed.   fluvoxaMINE (LUVOX) 100 MG tablet Take 1 tablet (100 mg total) by mouth at bedtime.   levonorgestrel (KYLEENA) 19.5 MG IUD Kyleena 17.5 mcg/24 hrs (27yrs) 19.5mg  intrauterine device  [facility]   LORazepam (ATIVAN) 0.5 MG tablet Take 1 tablet (0.5 mg total) by mouth 2 (two) times daily as needed for anxiety (Panic).   PREVIDENT 5000 BOOSTER PLUS 1.1 % PSTE Toothpaste qhs   VITAMIN D PO Take by mouth. OTC 500u daily   No facility-administered encounter medications on file as of 02/23/2021.  :   Review of Systems:  Out of a complete 14 point review of systems, all are reviewed and negative with the exception of these symptoms as listed below:   Review of Systems  Neurological:        Snoring, since last 09/2020 (after COVID).  Husband noted single episode of gurgling.  Now taking lorazepam/ luvox, seems to have started after taking those meds.  Wakes up rested since taking VIT D.  ESS.2 FSS 22.   Objective:  Neurological Exam  Physical Exam Physical Examination:   Vitals:   02/23/21 0911  BP: 119/72  Pulse: 79    General Examination: The patient is a very pleasant 31 y.o. female in no acute distress. She appears well-developed and well-nourished and well groomed.   HEENT: Normocephalic, atraumatic, pupils are equal, round and reactive to light, extraocular tracking is good without limitation to gaze excursion or nystagmus noted. Hearing is grossly intact. Face is symmetric with normal facial animation. Speech is clear with no dysarthria noted. There is no hypophonia. There is no lip, neck/head, jaw or voice tremor.  Neck is supple with full range of passive and active motion. There are no carotid bruits on auscultation. Oropharynx exam reveals: No significant mouth dryness, good dental hygiene, mild airway crowding secondary to small airway, prominent appearing uvula, tonsils on the smaller side, about 1+ bilaterally.  Mallampati class II.  Neck circumference of 15 1/8 inches.  Tongue protrudes centrally and palate elevates symmetrically.  She has a minimal overbite.   Chest: Clear to auscultation without wheezing, rhonchi or crackles noted.  Heart: S1+S2+0, regular and normal without murmurs, rubs or gallops noted.  Abdomen: Soft, non-tender and non-distended.  Extremities: There is no obvious edema in the distal lower extremities bilaterally.   Skin: Warm and dry without trophic changes noted.   Musculoskeletal: exam reveals no obvious joint deformities.   Neurologically:  Mental status: The patient is awake, alert and oriented in all 4 spheres. Her immediate and remote memory, attention, language skills and fund of knowledge are appropriate. There is no evidence of aphasia, agnosia, apraxia or anomia. Speech is clear with normal prosody and enunciation. Thought process is linear. Mood is normal and affect is normal.  Cranial nerves II - XII are as described above under HEENT exam.  Motor exam: Normal bulk, strength and tone is noted. There is no tremor, Romberg is negative. Reflexes are 2+ throughout. Fine motor skills and coordination: grossly intact.  Cerebellar testing: No dysmetria or intention tremor. There is no truncal or gait ataxia.  Sensory exam: intact to light touch in the upper and lower extremities.  Gait, station and balance: She stands easily. No veering to one side is noted. No leaning to one side is noted. Posture is age-appropriate and stance is narrow based. Gait shows normal stride length and normal pace. No problems turning are noted.   Assessment and Plan:   In summary, Lynnae Januarymanda  Hijazi is a very pleasant 31 y.o.-year old female with an underlying medical history of allergies, asthma, history of DVT, interstitial cystitis, psoriasis, vitamin D deficiency, anxiety, depression, and obesity, whose history and physical exam are concerning for obstructive sleep apnea (OSA). I had a long chat with the patient about my findings and the diagnosis of OSA, its prognosis and treatment options. We talked about medical treatments, surgical interventions and non-pharmacological approaches. I explained in particular the risks and ramifications of untreated moderate to severe OSA, especially with respect to developing cardiovascular disease down the Road, including congestive heart failure, difficult to treat hypertension, cardiac arrhythmias, or stroke. Even type 2 diabetes has, in part, been linked to untreated OSA. Symptoms of untreated OSA include daytime sleepiness, memory problems, mood irritability and mood disorder such as depression and anxiety, lack of energy, as well as recurrent headaches, especially morning headaches. We talked about trying to maintain a healthy lifestyle in general, as well as the importance of weight control. We also talked about the importance of good sleep hygiene. I recommended the following at this time: sleep study.  I have outlined the differences between a laboratory attended sleep study versus home sleep test. I explained the sleep test procedure to the patient and also outlined possible surgical and non-surgical treatment options of OSA, including the use of a custom-made dental device (which would require a referral to a specialist dentist or oral surgeon), upper airway surgical options, such as traditional UPPP or a novel less invasive surgical option in the form of Inspire hypoglossal nerve stimulation (which would involve a referral to an ENT surgeon). I also explained the CPAP treatment option to the patient, who indicated that she would be willing to try  CPAP if the need arises. I explained the importance of being compliant with PAP treatment, not only for insurance purposes but primarily to improve Her symptoms, and for the patient's long term health benefit, including to reduce Her cardiovascular risks. I answered all her questions today and the patient was in agreement. I plan to see her back after the sleep study is completed and encouraged her to call with any interim questions, concerns, problems or updates.   Thank you very much  for allowing me to participate in the care of this nice patient. If I can be of any further assistance to you please do not hesitate to call me at [phone].  Sincerely,   Huston FoleySaima Kimmie Doren, MD, PhD

## 2021-03-27 ENCOUNTER — Ambulatory Visit (INDEPENDENT_AMBULATORY_CARE_PROVIDER_SITE_OTHER): Payer: BC Managed Care – PPO | Admitting: Neurology

## 2021-03-27 ENCOUNTER — Other Ambulatory Visit: Payer: Self-pay

## 2021-03-27 DIAGNOSIS — G4733 Obstructive sleep apnea (adult) (pediatric): Secondary | ICD-10-CM | POA: Diagnosis not present

## 2021-03-27 DIAGNOSIS — Z9189 Other specified personal risk factors, not elsewhere classified: Secondary | ICD-10-CM

## 2021-03-27 DIAGNOSIS — E669 Obesity, unspecified: Secondary | ICD-10-CM

## 2021-03-27 DIAGNOSIS — R0683 Snoring: Secondary | ICD-10-CM

## 2021-03-27 DIAGNOSIS — F515 Nightmare disorder: Secondary | ICD-10-CM

## 2021-03-27 DIAGNOSIS — G4720 Circadian rhythm sleep disorder, unspecified type: Secondary | ICD-10-CM

## 2021-03-27 DIAGNOSIS — R0681 Apnea, not elsewhere classified: Secondary | ICD-10-CM

## 2021-03-31 NOTE — Procedures (Signed)
PATIENT'S NAME:  Daisy Singleton, Daisy Singleton DOB:      01-14-1991      MR#:    [id number]     DATE OF RECORDING: 03/27/2021 REFERRING M.D.:  Chana Bode, PA-C Study Performed:   Baseline Polysomnogram HISTORY: 31 year old woman with a history of allergies, asthma, history of DVT, interstitial cystitis, psoriasis, vitamin D deficiency, anxiety, depression, and obesity, who reports snoring and sleep disruption, abnormal dreams. The patient endorsed the Epworth Sleepiness Scale at 2 points. The patient's weight 200 pounds with a height of 61 (inches), resulting in a BMI of 37.9 kg/m2. The patient's neck circumference measured 15 1/8 inches.  CURRENT MEDICATIONS: Luvox, Kyleena, Ativan, Vitamin D   PROCEDURE:  This is a multichannel digital polysomnogram utilizing the Somnostar 11.2 system.  Electrodes and sensors were applied and monitored per AASM Specifications.   EEG, EOG, Chin and Limb EMG, were sampled at 200 Hz.  ECG, Snore and Nasal Pressure, Thermal Airflow, Respiratory Effort, CPAP Flow and Pressure, Oximetry was sampled at 50 Hz. Digital video and audio were recorded.      BASELINE STUDY  The patient took her Luvox and Ativan prior to lights out as well as Robitussin. She told the technician that she was getting over a sinus infection and still had cough. Lights Out was at 20:41 and Lights On at 05:12.  Total recording time (TRT) was 511.5 minutes, with a total sleep time (TST) of 378.5 minutes.   The patient's sleep latency was 86 minutes, which is delayed. REM latency was 117 minutes.  The sleep efficiency was 74. %.     SLEEP ARCHITECTURE: WASO (Wake after sleep onset) was 55 minutes with mild to moderate sleep fragmentation noted.  There were 34 minutes in Stage N1, 202 minutes Stage N2, 76.5 minutes Stage N3 and 66 minutes in Stage REM.  The percentage of Stage N1 was 9.%, which is increased, Stage N2 was 53.4%, which is normal, Stage N3 was 20.2%, which is normal, and Stage R (REM sleep) was 17.4%,  which is near-normal. The arousals were noted as: 84 were spontaneous, 0 were associated with PLMs, 10 were associated with respiratory events.  RESPIRATORY ANALYSIS:  There were a total of 47 respiratory events:  0 obstructive apneas, 0 central apneas and 0 mixed apneas with a total of 0 apneas and an apnea index (AI) of 0 /hour. There were 47 hypopneas with a hypopnea index of 7.5 /hour. The patient also had 0 respiratory event related arousals (RERAs).      The total APNEA/HYPOPNEA INDEX (AHI) was 7.5/hour and the total RESPIRATORY DISTURBANCE INDEX was  7.5 /hour.  25 events occurred in REM sleep and 44 events in NREM. The REM AHI was  22.7 /hour, versus a non-REM AHI of 4.2. The patient spent 169 minutes of total sleep time in the supine position and 210 minutes in non-supine.. The supine AHI was 15.3 versus a non-supine AHI of 1.1.   OXYGEN SATURATION & C02:  The Wake baseline 02 saturation was 98%, with the lowest being 87%. Time spent below 89% saturation equaled 2 minutes.  PERIODIC LIMB MOVEMENTS: The patient had a total of 7 Periodic Limb Movements.  The Periodic Limb Movement (PLM) index was 1.1 and the PLM Arousal index was 0/hour.  Audio and video analysis did not show any abnormal or unusual movements, behaviors, phonations or vocalizations. The patient took no bathroom breaks. Mild snoring was noted. The EKG was in keeping with normal sinus rhythm (NSR).  Post-study, the  patient indicated that sleep was the same as usual.   IMPRESSION:  Mild Obstructive Sleep Apnea (OSA) Dysfunctions associated with sleep stages or arousal from sleep  RECOMMENDATIONS:  This study demonstrates overall mild obstructive sleep apnea, moderate during supine and REM sleep with a total AHI of 7.5/hour, REM AHI of 22.7/hour, supine AHI of 15.3/hour and O2 nadir of 87%. Given the patient's medical history and sleep related complaints, treatment with positive airway pressure is recommended; this can be  achieved in the form of autoPAP. Alternatively, a full-night CPAP titration study would allow optimization of therapy if needed, down the road. Other treatment options may include avoidance of supine sleep position along with weight loss, or an oral appliance through dentistry, in selected patients.     Please note that untreated obstructive sleep apnea may carry additional perioperative morbidity. Patients with significant obstructive sleep apnea should receive perioperative PAP therapy and the surgeons and particularly the anesthesiologist should be informed of the diagnosis and the severity of the sleep disordered breathing. This study shows sleep fragmentation and abnormal sleep stage percentages; these are nonspecific findings and per se do not signify an intrinsic sleep disorder or a cause for the patient's sleep-related symptoms. Causes include (but are not limited to) the first night effect of the sleep study, circadian rhythm disturbances, medication effect or an underlying mood disorder or medical problem.  The patient should be cautioned not to drive, work at heights, or operate dangerous or heavy equipment when tired or sleepy. Review and reiteration of good sleep hygiene measures should be pursued with any patient. The patient will be seen in follow-up by Dr. Rexene Alberts at [facility] for discussion of the test results and further management strategies. The referring provider will be notified of the test results.  I certify that I have reviewed the entire raw data recording prior to the issuance of this report in accordance with the Standards of Accreditation of the American Academy of Sleep Medicine (AASM)  Star Age, MD, PhD Diplomat, American Board of Neurology and Sleep Medicine (Neurology and Sleep Medicine)

## 2021-03-31 NOTE — Addendum Note (Signed)
Addended by: Star Age on: 03/31/2021 05:46 PM   Modules accepted: Orders

## 2021-04-05 ENCOUNTER — Telehealth: Payer: Self-pay

## 2021-04-05 NOTE — Telephone Encounter (Signed)
-----   Message from Huston Foley, MD sent at 03/31/2021  5:45 PM EST ----- Patient referred by Crosby Oyster, PA, seen by me on 02/23/21, diagnostic PSG on 03/27/21.    Please call and notify the patient that the recent sleep study showed overall mild obstructive sleep apnea. I recommend treatment in the form of autoPAP, which means, that we don't have to bring her back for a second sleep study with CPAP, but will let him try an autoPAP machine at home, through a DME company (of her choice, or as per insurance requirement). The DME representative will educate her on how to use the machine, how to put the mask on, etc. I have placed an order in the chart. Please send referral, talk to patient, send report to referring MD. We will need a FU in sleep clinic for 10 weeks post-PAP set up, please arrange that with me or one of our NPs. Thanks,   Huston Foley, MD, PhD [facility])

## 2021-04-05 NOTE — Telephone Encounter (Signed)
I called pt. I advised pt that Dr. Frances Furbish reviewed their sleep study results and found that pt has mild osa. Dr. Frances Furbish recommends that pt start an auto pap at home. I reviewed PAP compliance expectations with the pt. Pt is agreeable to starting an auto-PAP. I advised pt that an order will be sent to a DME, [facility], and [facility] will call the pt within about one week after they file with the pt's insurance. [facility] will show the pt how to use the machine, fit for masks, and troubleshoot the auto-PAP if needed. A follow up appt was made for insurance purposes with Aundra Millet, NP on 07/06/2021 at 8:30am. Pt verbalized understanding to arrive 15 minutes early and bring their auto-PAP. A letter with all of this information in it will be mailed to the pt as a reminder. I verified with the pt that the address we have on file is correct. Pt verbalized understanding of results. Pt had no questions at this time but was encouraged to call back if questions arise. I have sent the order to [facility] and have received confirmation that they have received the order.

## 2021-05-02 ENCOUNTER — Ambulatory Visit: Payer: BC Managed Care – PPO | Admitting: Psychiatry

## 2021-05-12 ENCOUNTER — Encounter: Payer: Self-pay | Admitting: Psychiatry

## 2021-05-12 ENCOUNTER — Ambulatory Visit: Payer: BC Managed Care – PPO | Admitting: Psychiatry

## 2021-05-12 DIAGNOSIS — F411 Generalized anxiety disorder: Secondary | ICD-10-CM | POA: Diagnosis not present

## 2021-05-12 DIAGNOSIS — F410 Panic disorder [episodic paroxysmal anxiety] without agoraphobia: Secondary | ICD-10-CM

## 2021-05-12 MED ORDER — FLUVOXAMINE MALEATE 100 MG PO TABS: 100.0000 mg | ORAL_TABLET | Freq: Every day | ORAL | 1 refills | Status: DC

## 2021-05-12 MED ORDER — LORAZEPAM 0.5 MG PO TABS: ORAL_TABLET | ORAL | 3 refills | Status: DC

## 2021-05-12 NOTE — Progress Notes (Signed)
Daisy Singleton ?[id number] ?08-14-1990 ?31 y.o. ? ?Subjective:  ? ?Patient ID:  Daisy Singleton is a 31 y.o. (DOB 09-Feb-1990) female. ? ?Chief Complaint:  ?Chief Complaint  ?Patient presents with  ? Follow-up  ?  Anxiety  ? ? ?HPI ?Daisy Singleton presents to the office today for follow-up of anxiety. She reports that in Nov, Dec, and January she felt "overly good... a lot of energy." She took on more projects during that time. 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 She reports that her sleep did not change during that time. She got her nose pierced "but I am pretty happy about that." She reports that she has been less cautious compared to her baseline. Denies any excessive spending. Denies any increased talkativeness or being more social than usual. Denies any unexplained irritability. Denies racing thoughts. Has some difficulty sustaining focus and will want to jump to another activity and has been able to manage this. Denies hypersexuality. She reports that no one else has noticed a change in her mood and behavior.  ? ?Sleeping 7 hours a night most nights. She started using a cPap about a month ago and that she has not noticed a difference but her Fit Bit indicates improves sleep quality. Energy "comes and goes." Motivation has been good. Concentration is adequate overall and will want to do more than one task and will redirect this and makes lists. Appetite has been "normal." She has been trying to eat a healthier diet. She reports that in Nov and Dec she was "making pretty bad food choices." She reports that she and her husband started making changes in December with meal planning, meal prep, and eating less take out. Denies SI.  ? ?She reports feeling an "adequate amount of stress." She reports "I no longer spiral about things" and has not had obsessive thoughts or rumination like she had in the past. She reports occ intrusive thoughts. She reports some worry  about some work stress. Has had some occ chest tightness and increased HR in stressful situations. She feels sleepy after taking Ativan in the  ? ?Dog is nearing end of life. Had to get a new roof. ? ?Has eliminated caffeine.  ? ?Past Psychiatric Medication Trials: ?Luvox ?Fluoxetine ?Cymbalta ?Lorazepam ? ?PHQ2-9   ? ?Flowsheet Row Office Visit from 01/05/2020 in [facility] Visit from 12/04/2018 in [facility] Visit from 08/17/2017 in [facility] Visit from 02/11/2015 in Primary Care at [facility] Visit from 02/05/2015 in Primary Care at [facility]  ?PHQ-2 Total Score 0 0 0 0 0  ? ?  ?  ? ?Review of Systems:  ?Review of Systems  ?Gastrointestinal: Negative.   ?Musculoskeletal:  Negative for gait problem.  ?Neurological:  Negative for tremors.  ?Psychiatric/Behavioral:    ?     Please refer to HPI  ? ?Medications: I have reviewed the patient's current medications. ? ?Current Outpatient Medications  ?Medication Sig Dispense Refill  ? Clobetasol Propionate 0.05 % shampoo As needed.    ? levonorgestrel (KYLEENA) 19.5 MG IUD Kyleena 17.5 mcg/24 hrs (77yrs) 19.5mg  intrauterine device ? [facility]    ? PREVIDENT 5000 BOOSTER PLUS 1.1 % PSTE Toothpaste qhs  0  ? VITAMIN D PO Take by mouth. OTC 500u daily    ? fluvoxaMINE (LUVOX) 100 MG tablet Take 1 tablet (100 mg total) by mouth at bedtime. 90 tablet 1  ? LORazepam (ATIVAN) 0.5 MG  tablet Take 1/2-1 tablet at bedtime and 1/2-1 tab as needed for anxiety. 60 tablet 3  ? ?No current facility-administered medications for this visit.  ? ? ?Medication Side Effects: Reports that if she takes Fluvoxamine without Ativan she will wake up feeling shaky.  ? ?Allergies:  ?Allergies  ?Allergen Reactions  ? Cefdinir Diarrhea  ? Latex   ? Cortisone Hives and Other (See Comments)  ?  Red flush  ? ? ?Past Medical History:  ?Diagnosis Date  ? Allergy   ? Anxiety and depression   ? Asthma   ? Dyspareunia, female   ? Elevated  LDL cholesterol level   ? History of DVT of lower extremity   ? History of HPV infection   ? LEEP in the past  ? Interstitial cystitis   ? Psoriasis   ? Seasonal allergies   ? Vitamin D deficiency   ? ? ?Past Medical History, Surgical history, Social history, and Family history were reviewed and updated as appropriate.  ? ?Please see review of systems for further details on the patient's review from today.  ? ?Objective:  ? ?Physical Exam:  ?There were no vitals taken for this visit. ? ?Physical Exam ?Constitutional:   ?   General: She is not in acute distress. ?Musculoskeletal:     ?   General: No deformity.  ?Neurological:  ?   Mental Status: She is alert and oriented to person, place, and time.  ?   Coordination: Coordination normal.  ?Psychiatric:     ?   Attention and Perception: Attention and perception normal. She does not perceive auditory or visual hallucinations.     ?   Mood and Affect: Mood normal. Mood is not anxious or depressed. Affect is not labile, blunt, angry or inappropriate.     ?   Speech: Speech normal.     ?   Behavior: Behavior normal.     ?   Thought Content: Thought content normal. Thought content is not paranoid or delusional. Thought content does not include homicidal or suicidal ideation. Thought content does not include homicidal or suicidal plan.     ?   Cognition and Memory: Cognition and memory normal.     ?   Judgment: Judgment normal.  ?   Comments: Insight intact  ? ? ?Lab Review:  ?   ?Component Value Date/Time  ? NA 137 04/01/2020 0919  ? K 4.3 04/01/2020 0919  ? CL 104 04/01/2020 0919  ? CO2 17 (L) 04/01/2020 0919  ? GLUCOSE 96 04/01/2020 0919  ? GLUCOSE 105 (H) 09/25/2013 1626  ? BUN 9 04/01/2020 0919  ? CREATININE 0.77 04/01/2020 0919  ? CREATININE 0.80 09/25/2013 1626  ? CALCIUM 8.8 04/01/2020 0919  ? PROT 6.9 04/01/2020 0919  ? ALBUMIN 4.4 04/01/2020 0919  ? AST 13 04/01/2020 0919  ? ALT 14 04/01/2020 0919  ? ALKPHOS 20 (L) 04/01/2020 0919  ? BILITOT 0.3 04/01/2020 0919  ?  GFRNONAA 102 04/01/2020 0919  ? GFRAA 118 04/01/2020 0919  ? ? ?   ?Component Value Date/Time  ? WBC 5.3 04/01/2020 0919  ? WBC 7.0 09/25/2013 1643  ? RBC 4.54 04/01/2020 0919  ? RBC 4.78 09/25/2013 1643  ? HGB 13.3 04/01/2020 0919  ? HCT 39.9 04/01/2020 0919  ? PLT 278 04/01/2020 0919  ? MCV 88 04/01/2020 0919  ? MCH 29.3 04/01/2020 0919  ? MCH 28.8 09/25/2013 1643  ? MCHC 33.3 04/01/2020 0919  ? MCHC 32.8 09/25/2013 1643  ?  RDW 12.7 04/01/2020 0919  ? LYMPHSABS 1.7 04/01/2020 0919  ? EOSABS 0.0 04/01/2020 0919  ? BASOSABS 0.1 04/01/2020 0919  ? ? ?No results found for: POCLITH, LITHIUM  ? ?No results found for: PHENYTOIN, PHENOBARB, VALPROATE, CBMZ  ? ?.res ?Assessment: Plan:   ?Pt seen for 30 minutes and time spent discussing s/s of hypomania and mania. Discussed that recent symptoms do not meet diagnostic criteria for hypomania, however recommended that she contact office if she experiences hypomanic s/s in the future.  ?Will continue Luvox 100 mg po qd for anxiety.  ?Discussed that she could try to reduce Lorazepam to 1/2 tablet at bedtime. Discussed resuming one tablet if she experiences sleep disturbance or increased anxiety. Discussed that she could also use a 1/2 tab as needed during the day when occasional severe anxiety occurs since she reports drowsiness with 1 tablet. Discussed propranolol as a possible treatment option, however her HR is typically in the 60's and therefore would not recommend propranolol.  ?Pt to follow-up in 6 months or sooner if clinically indicated.  ?Patient advised to contact office with any questions, adverse effects, or acute worsening in signs and symptoms. ? ? ?Daisy Singleton was seen today for follow-up. ? ?Diagnoses and all orders for this visit: ? ?Panic disorder ?-     fluvoxaMINE (LUVOX) 100 MG tablet; Take 1 tablet (100 mg total) by mouth at bedtime. ?-     LORazepam (ATIVAN) 0.5 MG tablet; Take 1/2-1 tablet at bedtime and 1/2-1 tab as needed for anxiety. ? ?Generalized anxiety  disorder ?-     fluvoxaMINE (LUVOX) 100 MG tablet; Take 1 tablet (100 mg total) by mouth at bedtime. ?-     LORazepam (ATIVAN) 0.5 MG tablet; Take 1/2-1 tablet at bedtime and 1/2-1 tab as needed for

## 2021-07-01 ENCOUNTER — Other Ambulatory Visit: Payer: Self-pay | Admitting: Orthopaedic Surgery

## 2021-07-01 DIAGNOSIS — M79671 Pain in right foot: Secondary | ICD-10-CM

## 2021-07-06 ENCOUNTER — Ambulatory Visit: Payer: BC Managed Care – PPO | Admitting: Adult Health

## 2021-07-06 ENCOUNTER — Encounter: Payer: Self-pay | Admitting: Adult Health

## 2021-07-06 ENCOUNTER — Ambulatory Visit
Admission: RE | Admit: 2021-07-06 | Discharge: 2021-07-06 | Disposition: A | Discharge status: Home / Self Care | Payer: BC Managed Care – PPO | Source: Ambulatory Visit | Attending: Orthopaedic Surgery | Admitting: Orthopaedic Surgery

## 2021-07-06 VITALS — BP 118/86 | HR 57 | Ht 61.0 in | Wt 190.4 lb

## 2021-07-06 DIAGNOSIS — G4733 Obstructive sleep apnea (adult) (pediatric): Secondary | ICD-10-CM

## 2021-07-06 DIAGNOSIS — M79671 Pain in right foot: Secondary | ICD-10-CM

## 2021-07-06 DIAGNOSIS — Z9989 Dependence on other enabling machines and devices: Secondary | ICD-10-CM

## 2021-07-06 NOTE — Progress Notes (Signed)
PATIENT: Daisy Singleton DOB: 11/29/1990  REASON FOR VISIT: follow up HISTORY FROM: patient PRIMARY NEUROLOGIST: Dr. Rexene Alberts  Chief Complaint  Patient presents with   Follow-up    Rm 8, initial cpap     HISTORY OF PRESENT ILLNESS: Today 07/06/21:  Daisy Singleton is a 30 year old female is a history of OSA on CPAP. She returns today for follow-up.  Her sleep study showed mild sleep apnea.  She reports that CPAP has been working fairly well for her.  She states occasionally the straps will slip off her head.  Her download is below     REVIEW OF SYSTEMS: Out of a complete 14 system review of symptoms, the patient complains only of the following symptoms, and all other reviewed systems are negative.  FSS 16 ESS 2  ALLERGIES: Allergies  Allergen Reactions   Cefdinir Diarrhea   Latex    Cortisone Hives and Other (See Comments)    Red flush    HOME MEDICATIONS: Outpatient Medications Prior to Visit  Medication Sig Dispense Refill   Clobetasol Propionate 0.05 % shampoo As needed.     fluvoxaMINE (LUVOX) 100 MG tablet Take 1 tablet (100 mg total) by mouth at bedtime. 90 tablet 1   levonorgestrel (KYLEENA) 19.5 MG IUD Kyleena 17.5 mcg/24 hrs (42yrs) 19.5mg  intrauterine device  [facility]     LORazepam (ATIVAN) 0.5 MG tablet Take 1/2-1 tablet at bedtime and 1/2-1 tab as needed for anxiety. 60 tablet 3   PREVIDENT 5000 BOOSTER PLUS 1.1 % PSTE Toothpaste qhs  0   VITAMIN D PO Take by mouth. OTC 500u daily     No facility-administered medications prior to visit.    PAST MEDICAL HISTORY: Past Medical History:  Diagnosis Date   Allergy    Anxiety and depression    Asthma    Dyspareunia, female    Elevated LDL cholesterol level    History of DVT of lower extremity    History of HPV infection    LEEP in the past   Interstitial cystitis    Psoriasis    Seasonal allergies    Vitamin D deficiency     PAST SURGICAL HISTORY: Past Surgical History:  Procedure  Laterality Date   CERVICAL BIOPSY  W/ LOOP ELECTRODE EXCISION      FAMILY HISTORY: Family History  Problem Relation Age of Onset   Hyperlipidemia Mother    Hypertension Mother    Hyperlipidemia Father    Hypertension Father    Psoriasis Father    Cleft lip Sister    Cleft palate Sister    Hypertension Sister    Hyperlipidemia Sister     SOCIAL HISTORY: Social History   Socioeconomic History   Marital status: Married    Spouse name: Not on file   Number of children: Not on file   Years of education: Not on file   Highest education level: Not on file  Occupational History   Not on file  Tobacco Use   Smoking status: Never   Smokeless tobacco: Never  Vaping Use   Vaping Use: Never used  Substance and Sexual Activity   Alcohol use: Yes    Comment: 1 drink 3-4 nights per week    Drug use: No   Sexual activity: Yes    Partners: Male    Birth control/protection: Implant  Other Topics Concern   Not on file  Social History Narrative   Caffeine: Engineer, mining.  Education: masters.  Working: Chartered certified accountant, married.  No kids.    Social Determinants of Health   Financial Resource Strain: Not on file  Food Insecurity: Not on file  Transportation Needs: Not on file  Physical Activity: Not on file  Stress: Not on file  Social Connections: Not on file  Intimate Partner Violence: Not on file      PHYSICAL EXAM  Vitals:   07/06/21 0819  BP: 118/86  Pulse: (!) 57  Weight: 190 lb 6.4 oz (86.4 kg)  Height: 5\' 1"  (1.549 m)   Body mass index is 35.98 kg/m.  Generalized: Well developed, in no acute distress  Chest: Lungs clear to auscultation bilaterally  Neurological examination  Mentation: Alert oriented to time, place, history taking. Follows all commands speech and language fluent Cranial nerve II-XII: Extraocular movements were full, visual field were full on confrontational test Head turning and shoulder shrug  were normal and symmetric. Gait and station: Gait is  normal.    DIAGNOSTIC DATA (LABS, IMAGING, TESTING) - I reviewed patient records, labs, notes, testing and imaging myself where available.  Lab Results  Component Value Date   WBC 5.3 04/01/2020   HGB 13.3 04/01/2020   HCT 39.9 04/01/2020   MCV 88 04/01/2020   PLT 278 04/01/2020      Component Value Date/Time   NA 137 04/01/2020 0919   K 4.3 04/01/2020 0919   CL 104 04/01/2020 0919   CO2 17 (L) 04/01/2020 0919   GLUCOSE 96 04/01/2020 0919   GLUCOSE 105 (H) 09/25/2013 1626   BUN 9 04/01/2020 0919   CREATININE 0.77 04/01/2020 0919   CREATININE 0.80 09/25/2013 1626   CALCIUM 8.8 04/01/2020 0919   PROT 6.9 04/01/2020 0919   ALBUMIN 4.4 04/01/2020 0919   AST 13 04/01/2020 0919   ALT 14 04/01/2020 0919   ALKPHOS 20 (L) 04/01/2020 0919   BILITOT 0.3 04/01/2020 0919   GFRNONAA 102 04/01/2020 0919   GFRAA 118 04/01/2020 0919   Lab Results  Component Value Date   CHOL 226 (H) 01/06/2020   HDL 49 01/06/2020   LDLCALC 151 (H) 01/06/2020   TRIG 143 01/06/2020   CHOLHDL 4.6 (H) 01/06/2020   Lab Results  Component Value Date   HGBA1C 5.1 12/04/2018   Lab Results  Component Value Date   [id number] 05/28/2020   Lab Results  Component Value Date   TSH 1.220 04/01/2020      ASSESSMENT AND PLAN 31 y.o. year old female  has a past medical history of Allergy, Anxiety and depression, Asthma, Dyspareunia, female, Elevated LDL cholesterol level, History of DVT of lower extremity, History of HPV infection, Interstitial cystitis, Psoriasis, Seasonal allergies, and Vitamin D deficiency. here with:  OSA on CPAP  - CPAP compliance excellent - Good treatment of AHI  - Encourage patient to use CPAP nightly and > 4 hours each night - F/U in 1 year or sooner if needed     Ward Givens, MSN, NP-C 07/06/2021, 8:22 AM [facility] [address] [place], [place] [address] [phone]

## 2021-07-06 NOTE — Patient Instructions (Signed)
Continue using CPAP nightly and greater than 4 hours each night °If your symptoms worsen or you develop new symptoms please let us know.  ° °

## 2021-09-29 ENCOUNTER — Ambulatory Visit: Payer: BC Managed Care – PPO | Admitting: Family Medicine

## 2021-09-29 ENCOUNTER — Encounter: Payer: Self-pay | Admitting: Family Medicine

## 2021-09-29 VITALS — BP 118/82 | HR 83 | Temp 97.8°F | Ht 61.0 in | Wt 194.0 lb

## 2021-09-29 DIAGNOSIS — N3010 Interstitial cystitis (chronic) without hematuria: Secondary | ICD-10-CM | POA: Diagnosis not present

## 2021-09-29 DIAGNOSIS — R350 Frequency of micturition: Secondary | ICD-10-CM

## 2021-09-29 DIAGNOSIS — R3989 Other symptoms and signs involving the genitourinary system: Secondary | ICD-10-CM | POA: Diagnosis not present

## 2021-09-29 LAB — POCT URINALYSIS DIPSTICK
Bilirubin, UA: NEGATIVE
Blood, UA: NEGATIVE
Glucose, UA: NEGATIVE
Ketones, UA: NEGATIVE
Leukocytes, UA: NEGATIVE
Nitrite, UA: NEGATIVE
Protein, UA: NEGATIVE
Spec Grav, UA: 1.025 (ref 1.010–1.025)
Urobilinogen, UA: 1 E.U./dL
pH, UA: 6 (ref 5.0–8.0)

## 2021-09-29 NOTE — Progress Notes (Signed)
Subjective:  Daisy Singleton is a 31 y.o. female who complains of possible urinary tract infection.  She has had symptoms for 6 days.  Symptoms include  urinary frequency, urgency and suprapubic pain . Patient denies  fever, chills, back pain, nausea, vomiting or diarrhea.     Reports history of interstitial cystitis.  She has been not doing as well on her diet to prevent flareups.  Patient does not have a history of recurrent UTI. Patient does not have a history of pyelonephritis.  No other aggravating or relieving factors.  No other c/o.  Past Medical History:  Diagnosis Date   Allergy    Anxiety and depression    Asthma    Dyspareunia, female    Elevated LDL cholesterol level    History of DVT of lower extremity    History of HPV infection    LEEP in the past   Interstitial cystitis    Psoriasis    Seasonal allergies    Vitamin D deficiency     ROS as in subjective  Reviewed allergies, medications, past medical, surgical, and social history.    Objective: Vitals:   09/29/21 1556  BP: 118/82  Pulse: 83  Temp: 97.8 F (36.6 C)  SpO2: 98%    General appearance: alert, no distress, WD/WN, female Abdomen: +bs, soft, tender over suprapubic area and right lower quadrant, otherwise nontender, no rebound or guarding, non distended Back: no CVA tenderness GU: deferred      Laboratory:  Urine dipstick: negative for all components.       Assessment: Urinary frequency - Plan: Urine Culture, Urine Culture  Bladder pain - Plan: POCT urinalysis dipstick, Urine Culture, Urine Culture  Chronic interstitial cystitis   Plan: Discussed symptoms, diagnosis, possible complications, and usual course of illness.  Antibiotic not prescribed since UA negative.  Advised increased water intake, can use OTC Tylenol for pain.    Advised she may take AZO for the next 2 days.     Urine culture sent  Call or return if worse or not improving.

## 2021-09-29 NOTE — Patient Instructions (Signed)
Your urinalysis does not show an infection today but I will send your urine for culture.  We will be in touch with these results.

## 2021-09-30 LAB — URINE CULTURE

## 2021-10-14 ENCOUNTER — Ambulatory Visit (INDEPENDENT_AMBULATORY_CARE_PROVIDER_SITE_OTHER): Payer: BC Managed Care – PPO | Admitting: Family Medicine

## 2021-10-14 ENCOUNTER — Encounter: Payer: Self-pay | Admitting: Family Medicine

## 2021-10-14 VITALS — BP 108/78 | HR 83 | Temp 97.8°F | Ht 61.0 in | Wt 192.0 lb

## 2021-10-14 DIAGNOSIS — E559 Vitamin D deficiency, unspecified: Secondary | ICD-10-CM

## 2021-10-14 DIAGNOSIS — E669 Obesity, unspecified: Secondary | ICD-10-CM | POA: Diagnosis not present

## 2021-10-14 DIAGNOSIS — Z23 Encounter for immunization: Secondary | ICD-10-CM

## 2021-10-14 DIAGNOSIS — E7800 Pure hypercholesterolemia, unspecified: Secondary | ICD-10-CM | POA: Diagnosis not present

## 2021-10-14 DIAGNOSIS — E538 Deficiency of other specified B group vitamins: Secondary | ICD-10-CM | POA: Insufficient documentation

## 2021-10-14 DIAGNOSIS — Z0000 Encounter for general adult medical examination without abnormal findings: Secondary | ICD-10-CM | POA: Diagnosis not present

## 2021-10-14 LAB — COMPREHENSIVE METABOLIC PANEL
ALT: 12 U/L (ref 0–35)
AST: 14 U/L (ref 0–37)
Albumin: 4.2 g/dL (ref 3.5–5.2)
Alkaline Phosphatase: 26 U/L — ABNORMAL LOW (ref 39–117)
BUN: 11 mg/dL (ref 6–23)
CO2: 24 mEq/L (ref 19–32)
Calcium: 9.2 mg/dL (ref 8.4–10.5)
Chloride: 105 mEq/L (ref 96–112)
Creatinine, Ser: 0.78 mg/dL (ref 0.40–1.20)
GFR: 99.67 mL/min (ref >60.00)
Glucose, Bld: 89 mg/dL (ref 70–99)
Potassium: 3.9 mEq/L (ref 3.5–5.1)
Sodium: 137 mEq/L (ref 135–145)
Total Bilirubin: 0.7 mg/dL (ref 0.2–1.2)
Total Protein: 7.1 g/dL (ref 6.0–8.3)

## 2021-10-14 LAB — CBC WITH DIFFERENTIAL/PLATELET
Basophils Absolute: 0 10*3/uL (ref 0.0–0.1)
Basophils Relative: 0.6 % (ref 0.0–3.0)
Eosinophils Absolute: 0 10*3/uL (ref 0.0–0.7)
Eosinophils Relative: 0.6 % (ref 0.0–5.0)
HCT: 39.3 % (ref 36.0–46.0)
Hemoglobin: 13.3 g/dL (ref 12.0–15.0)
Lymphocytes Relative: 27.2 % (ref 12.0–46.0)
Lymphs Abs: 1.7 10*3/uL (ref 0.7–4.0)
MCHC: 33.8 g/dL (ref 30.0–36.0)
MCV: 89.3 fl (ref 78.0–100.0)
Monocytes Absolute: 0.4 10*3/uL (ref 0.1–1.0)
Monocytes Relative: 7.1 % (ref 3.0–12.0)
Neutro Abs: 3.9 10*3/uL (ref 1.4–7.7)
Neutrophils Relative %: 64.5 % (ref 43.0–77.0)
Platelets: 232 10*3/uL (ref 150.0–400.0)
RBC: 4.41 Mil/uL (ref 3.87–5.11)
RDW: 13.3 % (ref 11.5–15.5)
WBC: 6.1 10*3/uL (ref 4.0–10.5)

## 2021-10-14 LAB — HEMOGLOBIN A1C: Hgb A1c MFr Bld: 5.5 % (ref 4.6–6.5)

## 2021-10-14 LAB — LIPID PANEL
Cholesterol: 212 mg/dL — ABNORMAL HIGH (ref 0–200)
HDL: 48 mg/dL (ref >39.00)
LDL Cholesterol: 141 mg/dL — ABNORMAL HIGH (ref 0–99)
NonHDL: 163.67
Total CHOL/HDL Ratio: 4
Triglycerides: 115 mg/dL (ref 0.0–149.0)
VLDL: 23 mg/dL (ref 0.0–40.0)

## 2021-10-14 LAB — T4, FREE: Free T4: 0.79 ng/dL (ref 0.60–1.60)

## 2021-10-14 LAB — VITAMIN B12: Vitamin B-12: 229 pg/mL (ref 211–911)

## 2021-10-14 LAB — TSH: TSH: 1.08 u[IU]/mL (ref 0.35–5.50)

## 2021-10-14 LAB — VITAMIN D 25 HYDROXY (VIT D DEFICIENCY, FRACTURES): VITD: 75.55 ng/mL (ref 30.00–100.00)

## 2021-10-14 NOTE — Progress Notes (Unsigned)
Subjective:    Patient ID: Daisy Singleton, female    DOB: 1990-09-11, 31 y.o.   MRN: [id number]  HPI Chief Complaint  Patient presents with   Annual Exam   She is here for a complete physical exam and fasting labs.  Requests vitamin D and B12 labs due to hx of deficiencies.    Other providers: Neurologist- Dr. Frances Furbish  Psychiatrist- Corie Chiquito  OB/GYN- Dr. Marcelle Overlie  [facility]- diagnosed IC Orthopedist- Dr. 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 Flu shot today   Health maintenance:   Mammogram: cyst- benign  Colonoscopy: normal colon for issue  Last Gynecological Exam: last year and goes annually  Last Dental Exam: UTD  Last Eye Exam: UTD (annually)   Wears seatbelt always, uses sunscreen, smoke detectors in home and functioning, does not text while driving and feels safe in home environment.   Reviewed allergies, medications, past medical, surgical, family, and social history.    Review of Systems Review of Systems Constitutional: -fever, -chills, -sweats, -unexpected weight change,-fatigue ENT: -runny nose, -ear pain, -sore throat Cardiology:  -chest pain, -palpitations, -edema Respiratory: -cough, -shortness of breath, -wheezing Gastroenterology: -abdominal pain, -nausea, -vomiting, -diarrhea, -constipation  Hematology: -bleeding or bruising problems Musculoskeletal: -arthralgias, -myalgias, -joint swelling, -back pain Ophthalmology: -vision changes Urology: -dysuria, -difficulty urinating, -hematuria, -urinary frequency, -urgency Neurology: -headache, -weakness, -tingling, -numbness       Objective:   Physical Exam BP 108/78 (BP Location: Left Arm, Patient  Position: Sitting, Cuff Size: Large)   Pulse 83   Temp 97.8 F (36.6 C) (Temporal)   Ht 5\' 1"  (1.549 m)   Wt 192 lb (87.1 kg)   SpO2 96%   BMI 36.28 kg/m   General Appearance:    Alert, cooperative, no distress, appears stated age  Head:    Normocephalic, without obvious abnormality, atraumatic  Eyes:    PERRL, conjunctiva/corneas clear, EOM's intact  Ears:    Normal TM's and external ear canals  Nose:   Nares normal, mucosa normal, no drainage or sinus   tenderness  Throat:   Lips, mucosa, and tongue normal; teeth and gums normal  Neck:   Supple, no lymphadenopathy;  thyroid:  no   enlargement/tenderness/nodules; no JVD  Back:    Spine nontender, no curvature, ROM normal, no CVA     tenderness  Lungs:     Clear to auscultation bilaterally without wheezes, rales or     ronchi; respirations unlabored  Chest Wall:    No tenderness or deformity   Heart:    Regular rate and rhythm, S1 and S2 normal, no murmur, rub   or gallop  Breast Exam:    OB/GYN  Abdomen:     Soft, non-tender, nondistended, normoactive bowel sounds,    no masses, no hepatosplenomegaly  Genitalia:    OB/GYN  Rectal:    Not performed due to age<40 and no related complaints  Extremities:   No clubbing, cyanosis or edema  Pulses:   2+ and symmetric all extremities  Skin:   Skin color, texture, turgor normal, no rashes or lesions  Lymph nodes:   Cervical, supraclavicular, and axillary nodes normal  Neurologic:   CNII-XII intact, normal strength, sensation  and gait          Psych:   Normal mood, affect, hygiene and grooming.          Assessment & Plan:  Routine general medical examination at a health care facility - Plan: CBC with Differential/Platelet, Comprehensive metabolic panel, Comprehensive metabolic panel, CBC with Differential/Platelet -preventive health care reviewed. She sees OB/GYN. Recommend regular dental exam.  Counseling on health lifestyle including diet and exercise. Immunizations reviewed.  Discussed safety.   Need for influenza vaccination - Plan: Flu Vaccine QUAD 31mo+IM (Fluarix, Fluzone & Alfiuria Quad PF)  Elevated LDL cholesterol level - Plan: Lipid panel, Lipid panel -discussed low fat, low cholesterol diet  Obesity (BMI 30-39.9) - Plan: TSH, T4, free, Lipid panel, Hemoglobin A1c, Hemoglobin A1c, Lipid panel, T4, free, TSH -she is exercising and feels fit. Continue with healthy diet.   Vitamin D deficiency - Plan: VITAMIN D 25 Hydroxy (Vit-D Deficiency, Fractures), VITAMIN D 25 Hydroxy (Vit-D Deficiency, Fractures) -check vitamin D level   B12 deficiency - Plan: Vitamin B12, Vitamin B12 -check B12 level

## 2021-10-14 NOTE — Patient Instructions (Addendum)
Please go to the lab on the first floor before you leave today.  Continue taking good care of yourself  We will be in touch with your results   Preventive Care 14-33 Years Old, Female Preventive care refers to lifestyle choices and visits with your health care provider that can promote health and wellness. Preventive care visits are also called wellness exams. What can I expect for my preventive care visit? Counseling During your preventive care visit, your health care provider may ask about your: Medical history, including: Past medical problems. Family medical history. Pregnancy history. Current health, including: Menstrual cycle. Method of birth control. Emotional well-being. Home life and relationship well-being. Sexual activity and sexual health. Lifestyle, including: Alcohol, nicotine or tobacco, and drug use. Access to firearms. Diet, exercise, and sleep habits. Work and work Statistician. Sunscreen use. Safety issues such as seatbelt and bike helmet use. Physical exam Your health care provider may check your: Height and weight. These may be used to calculate your BMI (body mass index). BMI is a measurement that tells if you are at a healthy weight. Waist circumference. This measures the distance around your waistline. This measurement also tells if you are at a healthy weight and may help predict your risk of certain diseases, such as type 2 diabetes and high blood pressure. Heart rate and blood pressure. Body temperature. Skin for abnormal spots. What immunizations do I need?  Vaccines are usually given at various ages, according to a schedule. Your health care provider will recommend vaccines for you based on your age, medical history, and lifestyle or other factors, such as travel or where you work. What tests do I need? Screening Your health care provider may recommend screening tests for certain conditions. This may include: Pelvic exam and Pap test. Lipid and  cholesterol levels. Diabetes screening. This is done by checking your blood sugar (glucose) after you have not eaten for a while (fasting). Hepatitis B test. Hepatitis C test. HIV (human immunodeficiency virus) test. STI (sexually transmitted infection) testing, if you are at risk. BRCA-related cancer screening. This may be done if you have a family history of breast, ovarian, tubal, or peritoneal cancers. Talk with your health care provider about your test results, treatment options, and if necessary, the need for more tests. Follow these instructions at home: Eating and drinking  Eat a healthy diet that includes fresh fruits and vegetables, whole grains, lean protein, and low-fat dairy products. Take vitamin and mineral supplements as recommended by your health care provider. Do not drink alcohol if: Your health care provider tells you not to drink. You are pregnant, may be pregnant, or are planning to become pregnant. If you drink alcohol: Limit how much you have to 0-1 drink a day. Know how much alcohol is in your drink. In the [place] one drink equals one 12 oz bottle of beer (355 mL), one 5 oz glass of wine (148 mL), or one 1 oz glass of hard liquor (44 mL). Lifestyle Brush your teeth every morning and night with fluoride toothpaste. Floss one time each day. Exercise for at least 30 minutes 5 or more days each week. Do not use any products that contain nicotine or tobacco. These products include cigarettes, chewing tobacco, and vaping devices, such as e-cigarettes. If you need help quitting, ask your health care provider. Do not use drugs. If you are sexually active, practice safe sex. Use a condom or other form of protection to prevent STIs. If you do not wish to become  pregnant, use a form of birth control. If you plan to become pregnant, see your health care provider for a prepregnancy visit. Find healthy ways to manage stress, such as: Meditation, yoga, or listening to  music. Journaling. Talking to a trusted person. Spending time with friends and family. Minimize exposure to UV radiation to reduce your risk of skin cancer. Safety Always wear your seat belt while driving or riding in a vehicle. Do not drive: If you have been drinking alcohol. Do not ride with someone who has been drinking. If you have been using any mind-altering substances or drugs. While texting. When you are tired or distracted. Wear a helmet and other protective equipment during sports activities. If you have firearms in your house, make sure you follow all gun safety procedures. Seek help if you have been physically or sexually abused. What's next? Go to your health care provider once a year for an annual wellness visit. Ask your health care provider how often you should have your eyes and teeth checked. Stay up to date on all vaccines. This information is not intended to replace advice given to you by your health care provider. Make sure you discuss any questions you have with your health care provider. Document Revised: 07/21/2020 Document Reviewed: 07/21/2020 Elsevier Patient Education  [employer].

## 2021-11-03 ENCOUNTER — Encounter: Payer: Self-pay | Admitting: Family Medicine

## 2021-11-09 LAB — HM PAP SMEAR

## 2021-11-11 ENCOUNTER — Encounter: Payer: Self-pay | Admitting: Psychiatry

## 2021-11-11 ENCOUNTER — Ambulatory Visit: Payer: BC Managed Care – PPO | Admitting: Psychiatry

## 2021-11-11 DIAGNOSIS — F411 Generalized anxiety disorder: Secondary | ICD-10-CM

## 2021-11-11 DIAGNOSIS — F410 Panic disorder [episodic paroxysmal anxiety] without agoraphobia: Secondary | ICD-10-CM | POA: Diagnosis not present

## 2021-11-11 MED ORDER — LORAZEPAM 0.5 MG PO TABS: ORAL_TABLET | ORAL | 5 refills | Status: DC

## 2021-11-11 MED ORDER — FLUVOXAMINE MALEATE 100 MG PO TABS: 100.0000 mg | ORAL_TABLET | Freq: Every day | ORAL | 1 refills | Status: DC
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
## 2021-11-15 ENCOUNTER — Encounter: Payer: Self-pay | Admitting: Family Medicine

## 2022-01-13 ENCOUNTER — Ambulatory Visit: Payer: BC Managed Care – PPO | Admitting: Psychiatry

## 2022-01-13 ENCOUNTER — Encounter: Payer: Self-pay | Admitting: Psychiatry

## 2022-01-13 DIAGNOSIS — F411 Generalized anxiety disorder: Secondary | ICD-10-CM | POA: Diagnosis not present

## 2022-01-13 DIAGNOSIS — F410 Panic disorder [episodic paroxysmal anxiety] without agoraphobia: Secondary | ICD-10-CM | POA: Diagnosis not present
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
## 2022-03-01 ENCOUNTER — Encounter: Payer: Self-pay | Admitting: Family Medicine

## 2022-03-01 DIAGNOSIS — N3010 Interstitial cystitis (chronic) without hematuria: Secondary | ICD-10-CM

## 2022-03-01 NOTE — Telephone Encounter (Signed)
[place] for new referral to urologist? If so which dx?

## 2022-03-24 IMAGING — US US AXILLARY RIGHT
1 series · 12 of 15 positions shown · non-contrast
Comparison: Previous exam(s).
COMPARISON: Previous exam(s).

Addendum:
CLINICAL DATA: 29-year-old female with palpable lump in the RIGHT
axilla identified on self-examination, unchanged for 4 months.

EXAM:
DIGITAL DIAGNOSTIC BILATERAL MAMMOGRAM WITH CAD AND TOMO
ULTRASOUND RIGHT AXILLA

[Series 1: us axillary right · 0.06mm/px · 12 of 15 slices shown]
[im 1/15]
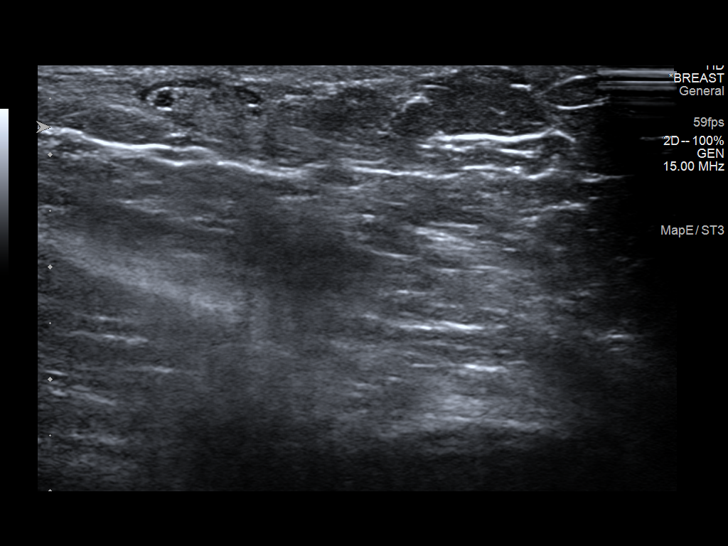
[im 2/15]
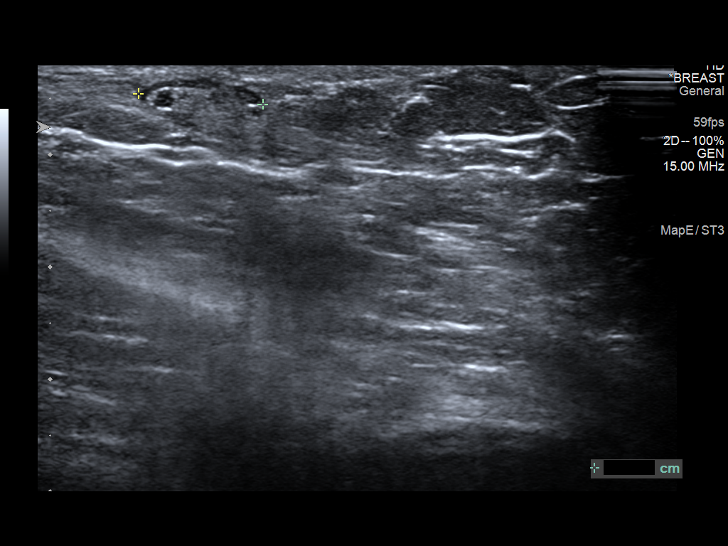
[im 4/15]
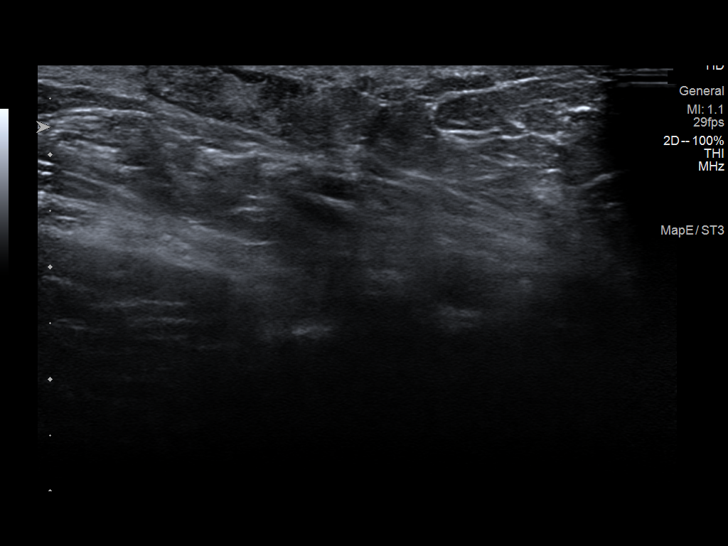
[im 5/15]
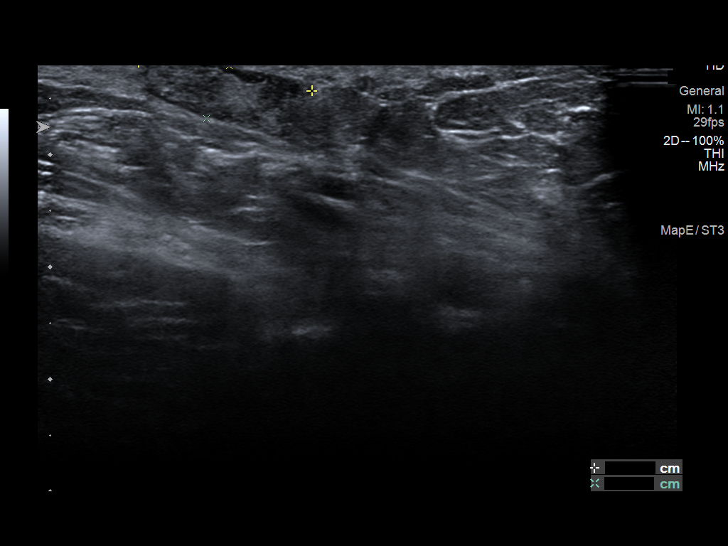
[im 6/15]
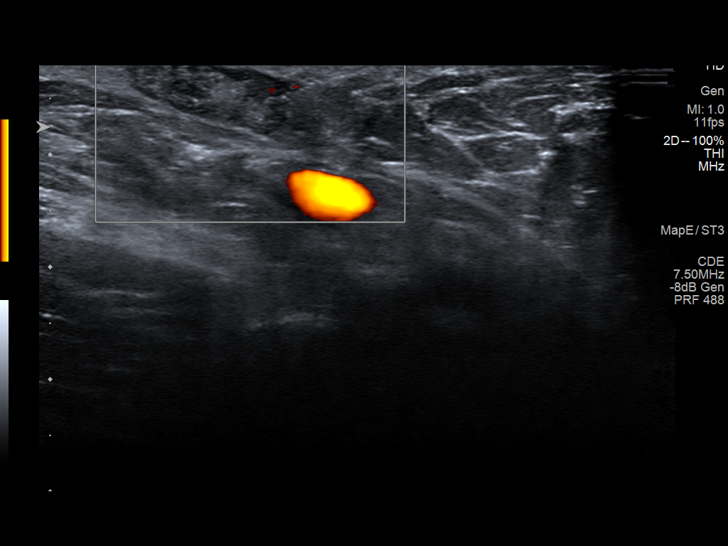
[im 7/15]
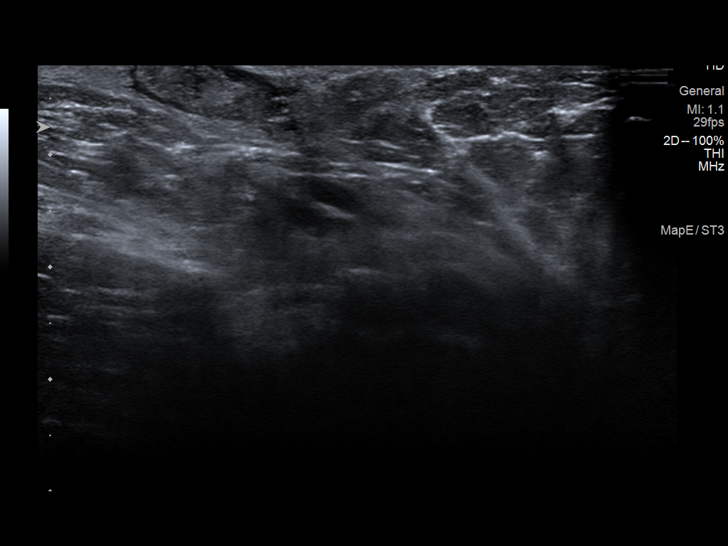
[im 9/15]
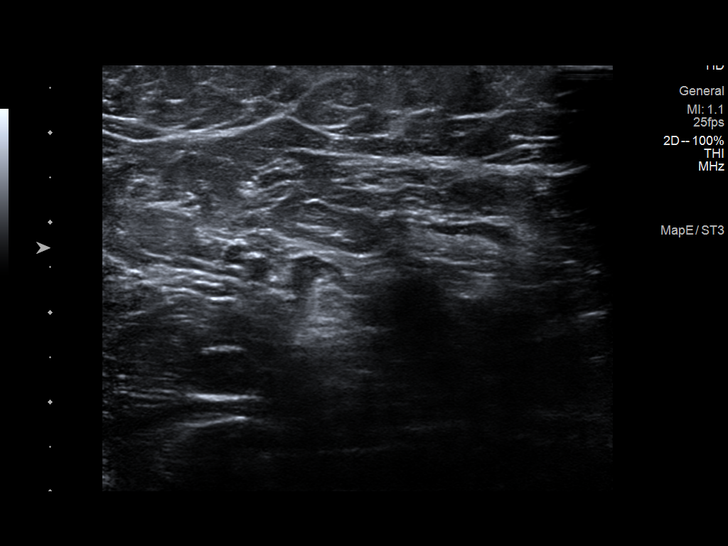
[im 10/15]
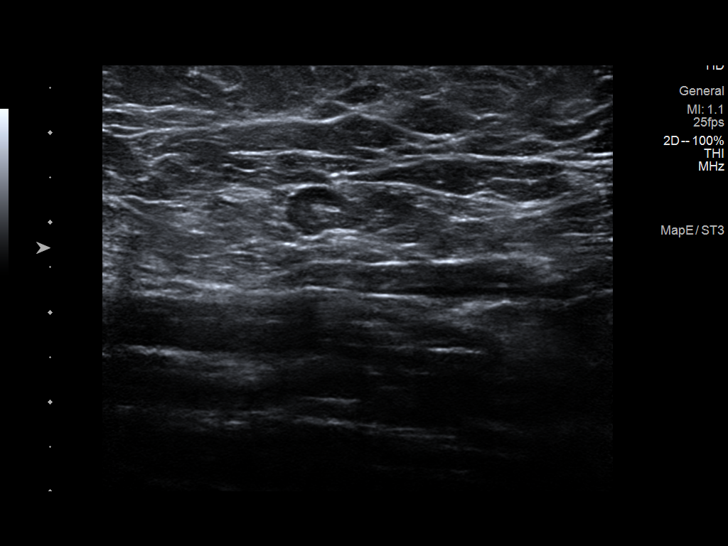
[im 11/15]
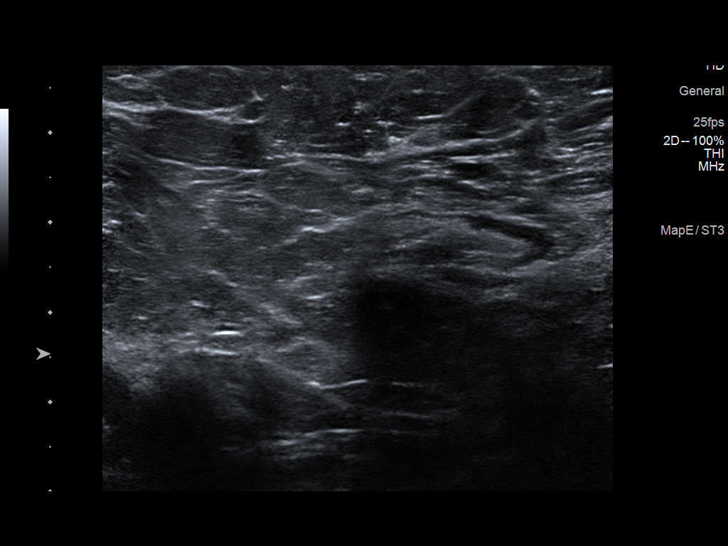
[im 12/15]
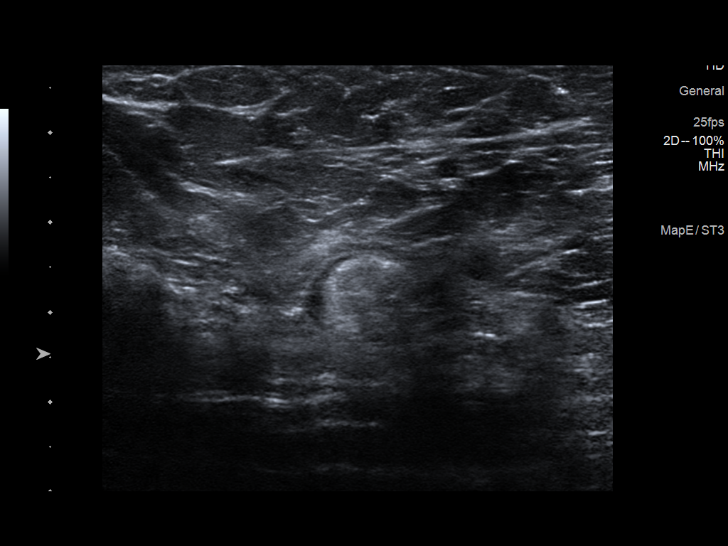
[im 14/15]
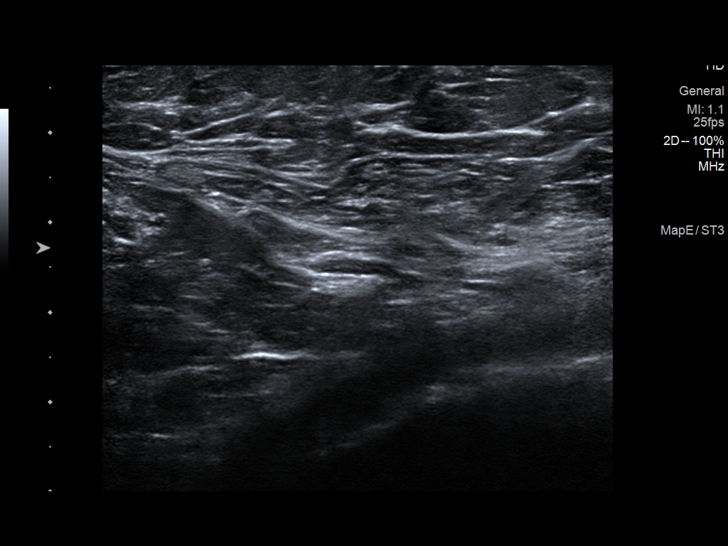
[im 15/15]
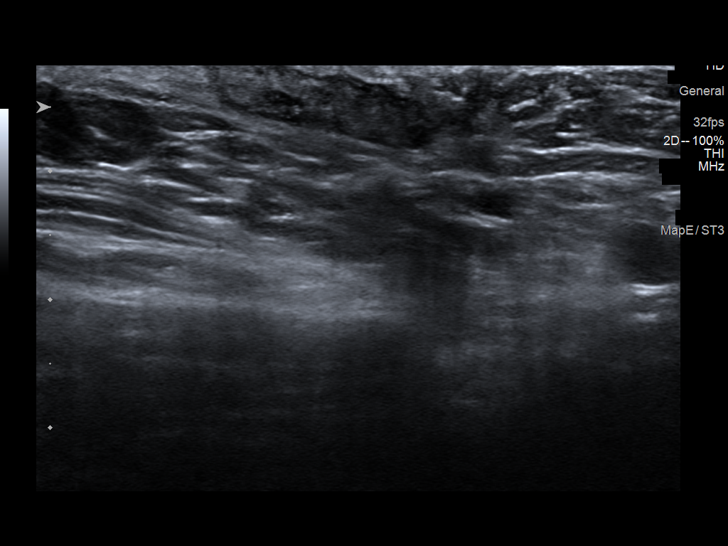

[12 of 15 positions shown; findings below may reference images not displayed]

ACR Breast Density Category b: There are scattered areas of
fibroglandular density.
FINDINGS: 2D/3D full field views of both breasts and spot compression view of
the RIGHT axilla demonstrate no suspicious mass, distortion or
worrisome calcifications. Minimal superficial asymmetry in the RIGHT
axilla at the site of patient's palpable abnormality noted.

Mammographic images were processed with CAD.

On physical exam, mild thickening in the RIGHT axilla is identified.

Targeted ultrasound is performed, showing a 1.6 x 0.5 x 1.1 cm
circumscribed oval heterogeneous mass with a connection to the skin
located in the RIGHT axilla at the site of patient's palpable
abnormality. This is compatible with a benign epidermoid/sebaceous
cyst.
IMPRESSION: 1. 1.6 cm superficial mass with a connection to the skin in the
RIGHT axilla at the site of patient's palpable abnormality,
compatible with a benign epidermoid/sebaceous cyst.
2. No mammographic evidence of breast malignancy.

RECOMMENDATION:
Consider clinical follow-up as indicated. Any further workup should
be based on clinical grounds.

Bilateral screening mammogram at age 38.

I have discussed the findings and recommendations with the patient.
She was instructed to contact her primary physician or our office if
the palpable RIGHT axillary abnormality increased in size by double
or more. If applicable, a reminder letter will be sent to the
patient regarding the next appointment.

BI-RADS CATEGORY  2: Benign.

ADDENDUM:
COMPARISON should read None.

*** End of Addendum ***
ACR Breast Density Category b: There are scattered areas of
fibroglandular density.
FINDINGS: 2D/3D full field views of both breasts and spot compression view of
the RIGHT axilla demonstrate no suspicious mass, distortion or
worrisome calcifications. Minimal superficial asymmetry in the RIGHT
axilla at the site of patient's palpable abnormality noted.

Mammographic images were processed with CAD.

On physical exam, mild thickening in the RIGHT axilla is identified.

Targeted ultrasound is performed, showing a 1.6 x 0.5 x 1.1 cm
circumscribed oval heterogeneous mass with a connection to the skin
located in the RIGHT axilla at the site of patient's palpable
abnormality. This is compatible with a benign epidermoid/sebaceous
cyst.
IMPRESSION: 1. 1.6 cm superficial mass with a connection to the skin in the
RIGHT axilla at the site of patient's palpable abnormality,
compatible with a benign epidermoid/sebaceous cyst.
2. No mammographic evidence of breast malignancy.

RECOMMENDATION:
Consider clinical follow-up as indicated. Any further workup should
be based on clinical grounds.

Bilateral screening mammogram at age 38.

I have discussed the findings and recommendations with the patient.
She was instructed to contact her primary physician or our office if
the palpable RIGHT axillary abnormality increased in size by double
or more. If applicable, a reminder letter will be sent to the
patient regarding the next appointment.

BI-RADS CATEGORY  2: Benign.

## 2022-03-24 IMAGING — MG DIGITAL DIAGNOSTIC BILAT W/ TOMO W/ CAD
5 of 10 series · 5 of 30 positions shown · non-contrast
Comparison: Previous exam(s).
COMPARISON: Previous exam(s).

Addendum:
CLINICAL DATA: 29-year-old female with palpable lump in the RIGHT
axilla identified on self-examination, unchanged for 4 months.

EXAM:
DIGITAL DIAGNOSTIC BILATERAL MAMMOGRAM WITH CAD AND TOMO
ULTRASOUND RIGHT AXILLA

[R MLO synth-2D (1 of 2)]
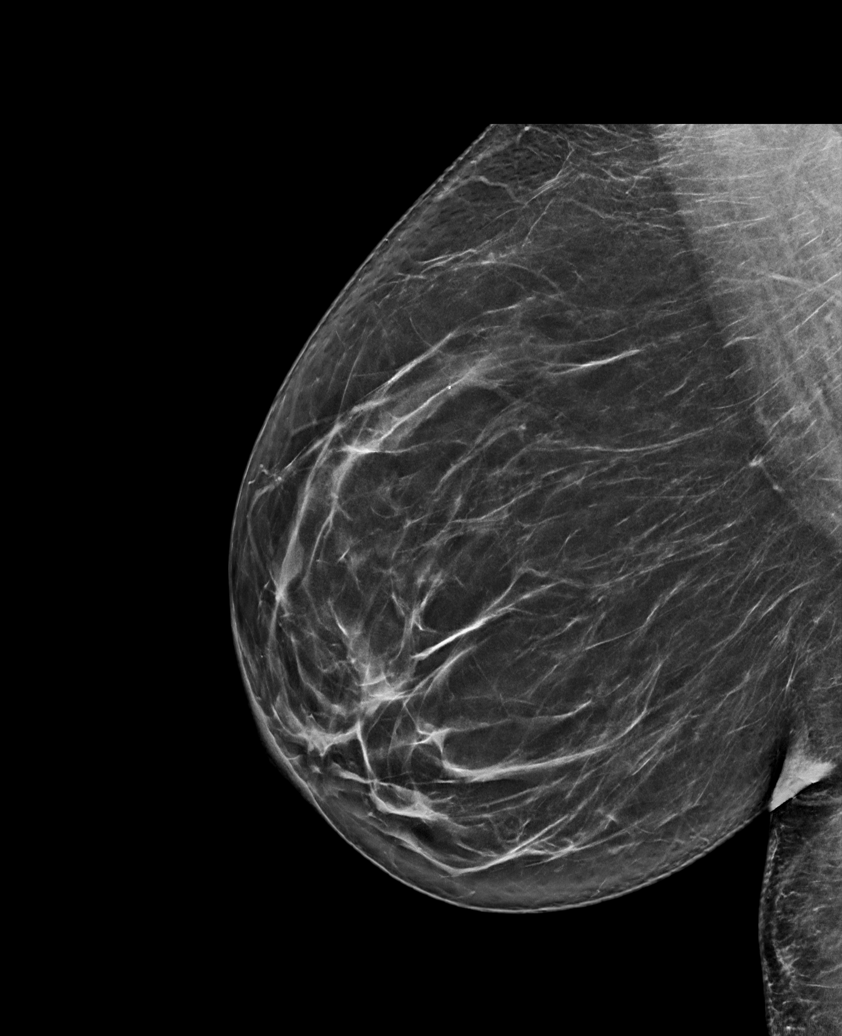

[R CC synth-2D]
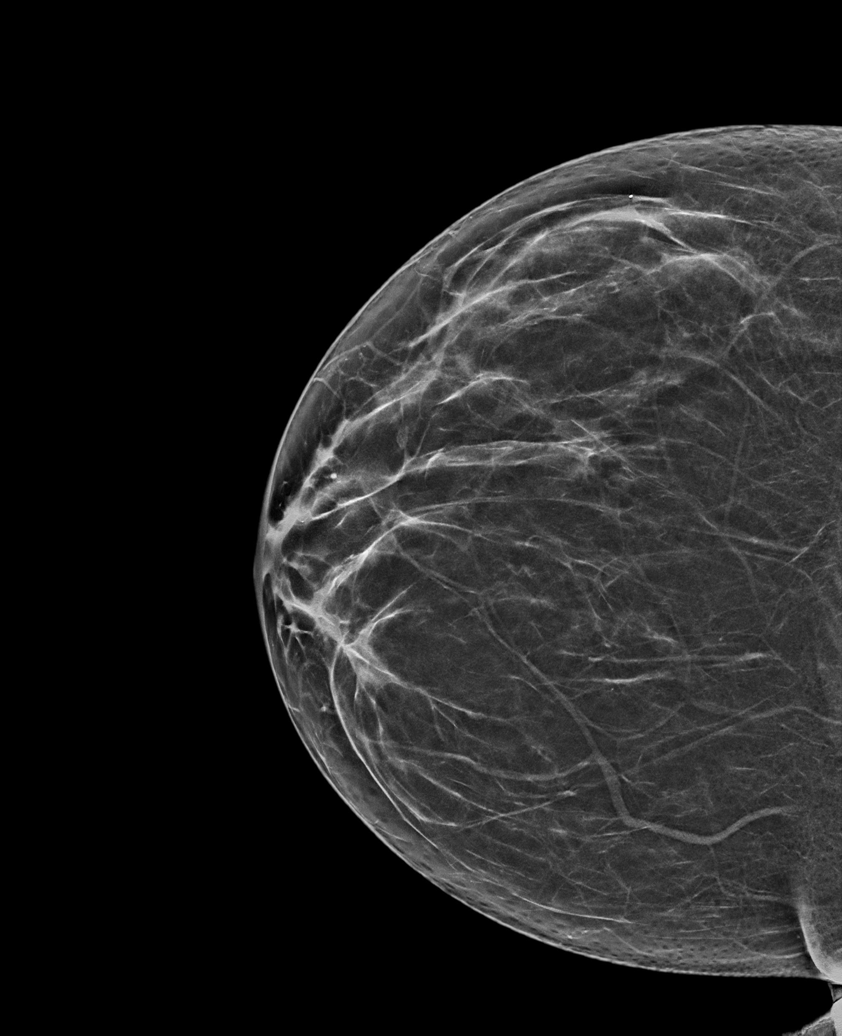

[L MLO synth-2D]
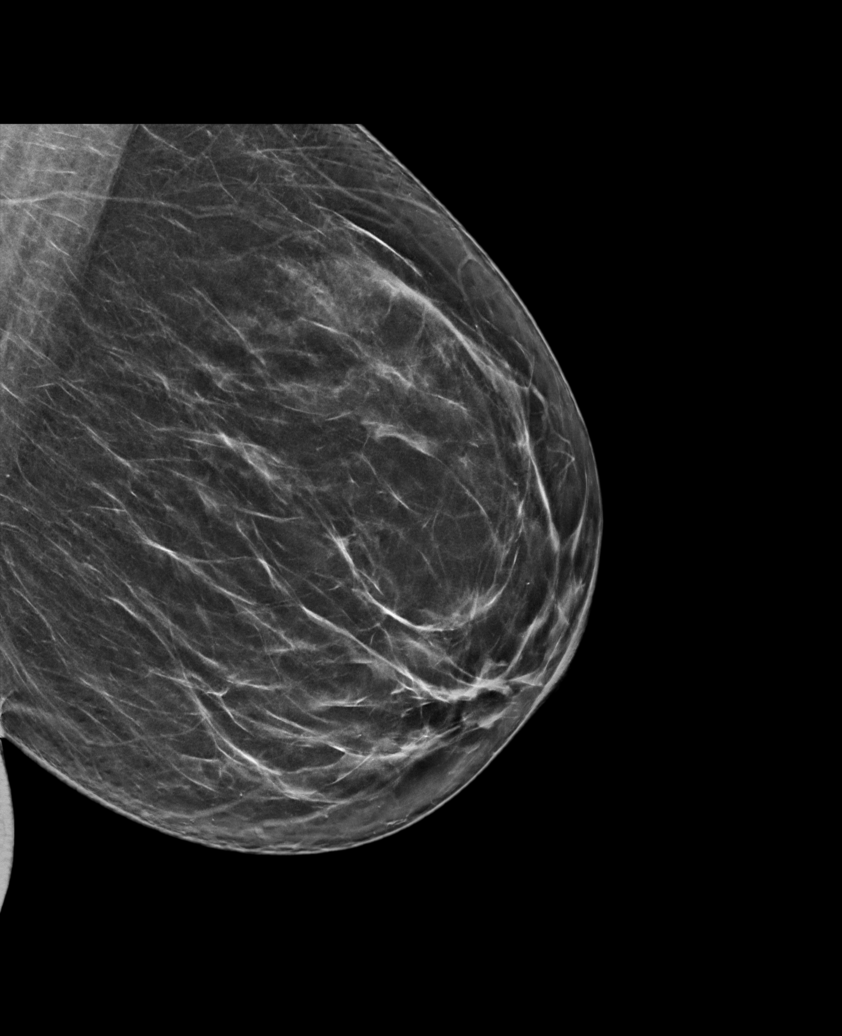

[L CC synth-2D]
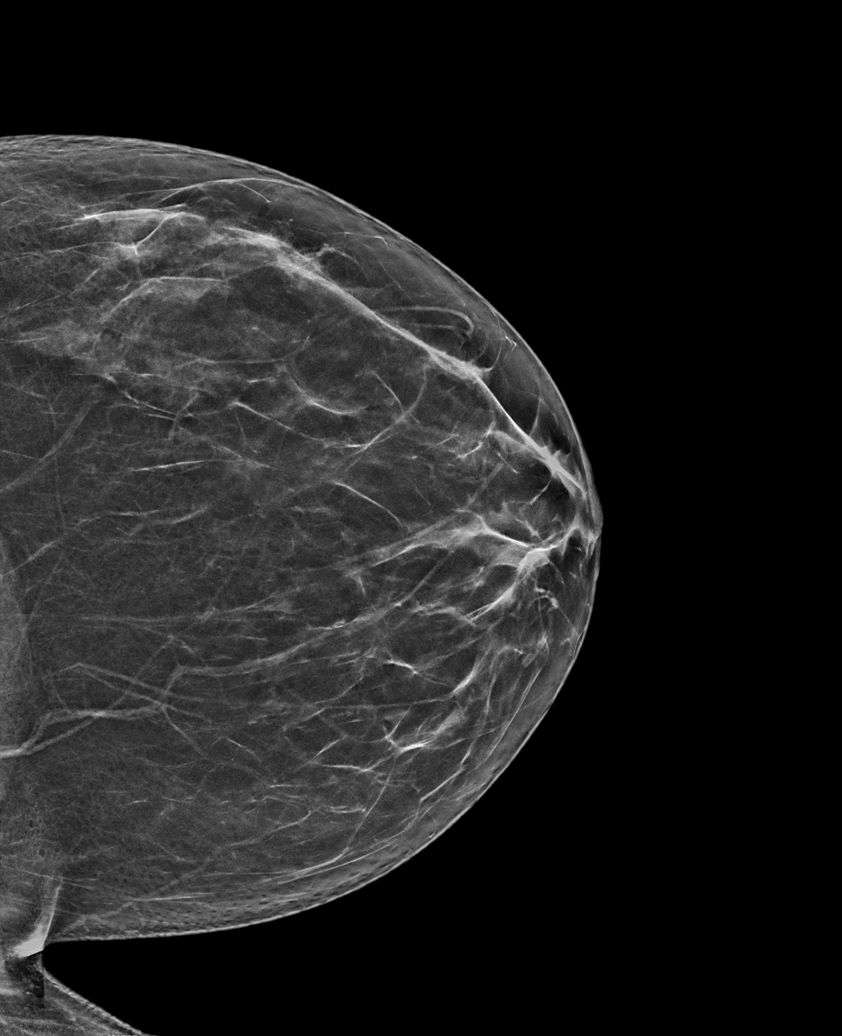

[R MLO synth-2D (2 of 2)]
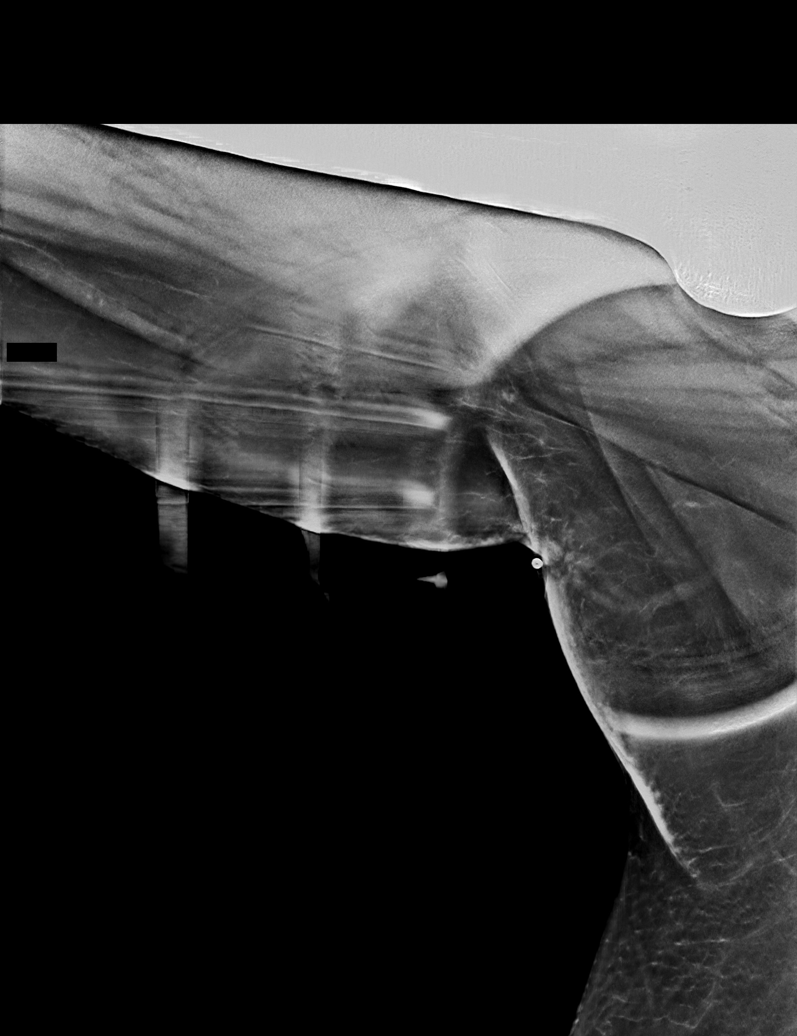

[5 of 30 positions shown; findings below may reference images not displayed]

ACR Breast Density Category b: There are scattered areas of
fibroglandular density.
FINDINGS: 2D/3D full field views of both breasts and spot compression view of
the RIGHT axilla demonstrate no suspicious mass, distortion or
worrisome calcifications. Minimal superficial asymmetry in the RIGHT
axilla at the site of patient's palpable abnormality noted.

Mammographic images were processed with CAD.

On physical exam, mild thickening in the RIGHT axilla is identified.

Targeted ultrasound is performed, showing a 1.6 x 0.5 x 1.1 cm
circumscribed oval heterogeneous mass with a connection to the skin
located in the RIGHT axilla at the site of patient's palpable
abnormality. This is compatible with a benign epidermoid/sebaceous
cyst.
IMPRESSION: 1. 1.6 cm superficial mass with a connection to the skin in the
RIGHT axilla at the site of patient's palpable abnormality,
compatible with a benign epidermoid/sebaceous cyst.
2. No mammographic evidence of breast malignancy.

RECOMMENDATION:
Consider clinical follow-up as indicated. Any further workup should
be based on clinical grounds.

Bilateral screening mammogram at age 38.

I have discussed the findings and recommendations with the patient.
She was instructed to contact her primary physician or our office if
the palpable RIGHT axillary abnormality increased in size by double
or more. If applicable, a reminder letter will be sent to the
patient regarding the next appointment.

BI-RADS CATEGORY  2: Benign.

ADDENDUM:
COMPARISON should read None.

*** End of Addendum ***
ACR Breast Density Category b: There are scattered areas of
fibroglandular density.
FINDINGS: 2D/3D full field views of both breasts and spot compression view of
the RIGHT axilla demonstrate no suspicious mass, distortion or
worrisome calcifications. Minimal superficial asymmetry in the RIGHT
axilla at the site of patient's palpable abnormality noted.

Mammographic images were processed with CAD.

On physical exam, mild thickening in the RIGHT axilla is identified.

Targeted ultrasound is performed, showing a 1.6 x 0.5 x 1.1 cm
circumscribed oval heterogeneous mass with a connection to the skin
located in the RIGHT axilla at the site of patient's palpable
abnormality. This is compatible with a benign epidermoid/sebaceous
cyst.
IMPRESSION: 1. 1.6 cm superficial mass with a connection to the skin in the
RIGHT axilla at the site of patient's palpable abnormality,
compatible with a benign epidermoid/sebaceous cyst.
2. No mammographic evidence of breast malignancy.

RECOMMENDATION:
Consider clinical follow-up as indicated. Any further workup should
be based on clinical grounds.

Bilateral screening mammogram at age 38.

I have discussed the findings and recommendations with the patient.
She was instructed to contact her primary physician or our office if
the palpable RIGHT axillary abnormality increased in size by double
or more. If applicable, a reminder letter will be sent to the
patient regarding the next appointment.

BI-RADS CATEGORY  2: Benign.

## 2022-04-05 ENCOUNTER — Encounter: Payer: Self-pay | Admitting: Family Medicine

## 2022-04-05 NOTE — Telephone Encounter (Signed)
Pt had night sweats twice this month and is wondering if this could be hormonal imbalance or perimenopause. Schedule ov?

## 2022-04-14 ENCOUNTER — Ambulatory Visit: Payer: BC Managed Care – PPO | Admitting: Family Medicine

## 2022-04-14 ENCOUNTER — Encounter: Payer: Self-pay | Admitting: Family Medicine

## 2022-04-14 VITALS — BP 108/72 | HR 64 | Temp 97.6°F | Ht 61.0 in | Wt 199.0 lb

## 2022-04-14 DIAGNOSIS — E538 Deficiency of other specified B group vitamins: Secondary | ICD-10-CM

## 2022-04-14 DIAGNOSIS — E669 Obesity, unspecified: Secondary | ICD-10-CM

## 2022-04-14 DIAGNOSIS — R202 Paresthesia of skin: Secondary | ICD-10-CM

## 2022-04-14 DIAGNOSIS — E7800 Pure hypercholesterolemia, unspecified: Secondary | ICD-10-CM

## 2022-04-14 LAB — TSH: TSH: 1.18 u[IU]/mL (ref 0.35–5.50)

## 2022-04-14 LAB — COMPREHENSIVE METABOLIC PANEL
ALT: 16 U/L (ref 0–35)
AST: 17 U/L (ref 0–37)
Albumin: 4.1 g/dL (ref 3.5–5.2)
Alkaline Phosphatase: 22 U/L — ABNORMAL LOW (ref 39–117)
BUN: 11 mg/dL (ref 6–23)
CO2: 26 mEq/L (ref 19–32)
Calcium: 9.6 mg/dL (ref 8.4–10.5)
Chloride: 104 mEq/L (ref 96–112)
Creatinine, Ser: 0.87 mg/dL (ref 0.40–1.20)
GFR: 87.13 mL/min (ref >60.00)
Glucose, Bld: 80 mg/dL (ref 70–99)
Potassium: 4.1 mEq/L (ref 3.5–5.1)
Sodium: 138 mEq/L (ref 135–145)
Total Bilirubin: 0.5 mg/dL (ref 0.2–1.2)
Total Protein: 7 g/dL (ref 6.0–8.3)

## 2022-04-14 LAB — LIPID PANEL
Cholesterol: 235 mg/dL — ABNORMAL HIGH (ref 0–200)
HDL: 50.2 mg/dL (ref >39.00)
LDL Cholesterol: 156 mg/dL — ABNORMAL HIGH (ref 0–99)
NonHDL: 184.47
Total CHOL/HDL Ratio: 5
Triglycerides: 141 mg/dL (ref 0.0–149.0)
VLDL: 28.2 mg/dL (ref 0.0–40.0)

## 2022-04-14 LAB — CBC WITH DIFFERENTIAL/PLATELET
Basophils Absolute: 0.1 10*3/uL (ref 0.0–0.1)
Basophils Relative: 1.1 % (ref 0.0–3.0)
Eosinophils Absolute: 0.1 10*3/uL (ref 0.0–0.7)
Eosinophils Relative: 1 % (ref 0.0–5.0)
HCT: 41.7 % (ref 36.0–46.0)
Hemoglobin: 13.7 g/dL (ref 12.0–15.0)
Lymphocytes Relative: 36.8 % (ref 12.0–46.0)
Lymphs Abs: 2.2 10*3/uL (ref 0.7–4.0)
MCHC: 32.9 g/dL (ref 30.0–36.0)
MCV: 90 fl (ref 78.0–100.0)
Monocytes Absolute: 0.4 10*3/uL (ref 0.1–1.0)
Monocytes Relative: 7.5 % (ref 3.0–12.0)
Neutro Abs: 3.2 10*3/uL (ref 1.4–7.7)
Neutrophils Relative %: 53.6 % (ref 43.0–77.0)
Platelets: 245 10*3/uL (ref 150.0–400.0)
RBC: 4.63 Mil/uL (ref 3.87–5.11)
RDW: 13.8 % (ref 11.5–15.5)
WBC: 5.9 10*3/uL (ref 4.0–10.5)

## 2022-04-14 LAB — T4, FREE: Free T4: 0.62 ng/dL (ref 0.60–1.60)

## 2022-04-14 LAB — VITAMIN B12: Vitamin B-12: 158 pg/mL — ABNORMAL LOW (ref 211–911)

## 2022-04-14 NOTE — Progress Notes (Unsigned)
Subjective:     Patient ID: Daisy Singleton, female    DOB: 04/29/1990, 32 y.o.   MRN: [id number]  Chief Complaint  Patient presents with   Hyperlipidemia    Had health wellness at work 04/11/22 (was not fasting) and they had cholesterol checked and levels were:  TC- 255 HDL-43 TRG-179 LDL-176 Non-HDL-212 TC/HDL-6  Is fasting today for labwork   Night Sweats   Numbness    Numbness in fingers starting in the morning lasting until 9-10 am, intermittent and not sure if related   Weight Gain    Doing lots of exercise, healthy diet and ran an iron man over the last month and despite efforts gained 4 pounds    Hyperlipidemia   Patient is in today for multiple concerns.   Other providers: Neurologist- Dr. Rexene Alberts  Psychiatrist- Thayer Headings  OB/GYN- Dr. Matthew Saras  [facility]- diagnosed IC Orthopedist- Dr. Rhona Raider (2016 for knee issues)  Dermatologist - Lyndle Herrlich  Eyes- [facility]   HLD- recently had it checked at work and she was not fasting. LDL last year 141  Weight gain with increased exercise and recent iron man race.  Teaches yoga.  She has done crossfit in past and done a weight challenge.   Numbness in fingers. Hx of B12 deficiencies.   IUD.  Light periods.   No weakness or pain. Denies fever, chills, dizziness, chest pain, palpitations, shortness of breath, abdominal pain, N/V/D, urinary symptoms, LE edema.       Health Maintenance Due  Topic Date Due   COVID-19 Vaccine (4 - 2023-24 season) 10/07/2021    Past Medical History:  Diagnosis Date   Allergy    Anxiety and depression    Asthma    Dyspareunia, female    Elevated LDL cholesterol level    History of DVT of lower extremity    History of HPV infection    LEEP in the past   Interstitial cystitis    Psoriasis    Seasonal allergies    Vitamin D deficiency     Past Surgical History:  Procedure Laterality Date   CERVICAL BIOPSY  W/ LOOP ELECTRODE EXCISION     FOOT  SURGERY      Family History  Problem Relation Age of Onset   Asthma Mother    Hyperlipidemia Mother    Hypertension Mother    Hyperlipidemia Father    Hypertension Father    Psoriasis Father    Cleft lip Sister    Cleft palate Sister    Hypertension Sister    Hyperlipidemia Sister     Social History   Socioeconomic History   Marital status: Married    Spouse name: Not on file   Number of children: Not on file   Years of education: Not on file   Highest education level: Not on file  Occupational History   Not on file  Tobacco Use   Smoking status: Never   Smokeless tobacco: Never  Vaping Use   Vaping Use: Never used  Substance and Sexual Activity   Alcohol use: Yes    Comment: 1 drink 3-4 nights per week    Drug use: No   Sexual activity: Yes    Partners: Male    Birth control/protection: Implant  Other Topics Concern   Not on file  Social History Narrative   Caffeine: Engineer, mining.  Education: masters.  Working: Chartered certified accountant, married.  No kids.    Social Determinants of Health   Financial  Resource Strain: Not on file  Food Insecurity: Not on file  Transportation Needs: Not on file  Physical Activity: Not on file  Stress: Not on file  Social Connections: Not on file  Intimate Partner Violence: Not on file    Outpatient Medications Prior to Visit  Medication Sig Dispense Refill   fluvoxaMINE (LUVOX) 100 MG tablet Take 1 tablet (100 mg total) by mouth at bedtime. 90 tablet 1   levonorgestrel (KYLEENA) 19.5 MG IUD Kyleena 17.5 mcg/24 hrs (15yr) 19.'5mg'$  intrauterine device  [facility]     PREVIDENT 5000 BOOSTER PLUS 1.1 % PSTE Toothpaste qhs  0   Clobetasol Propionate 0.05 % shampoo As needed. (Patient not taking: Reported on 04/14/2022)     LORazepam (ATIVAN) 0.5 MG tablet Take 1 tablet at bedtime and 1/2-1 tab as needed for anxiety. (Patient not taking: Reported on 04/14/2022) 60 tablet 5   VITAMIN D PO Take by mouth. OTC 500u daily (Patient not taking:  Reported on 04/14/2022)     No facility-administered medications prior to visit.    Allergies  Allergen Reactions   Cefdinir Diarrhea   Latex    Cortisone Hives and Other (See Comments)    Red flush    ROS     Objective:    Physical Exam Constitutional:      General: She is not in acute distress.    Appearance: She is not ill-appearing.  HENT:     Mouth/Throat:     Mouth: Mucous membranes are moist.  Eyes:     Conjunctiva/sclera: Conjunctivae normal.  Cardiovascular:     Rate and Rhythm: Normal rate.  Pulmonary:     Effort: Pulmonary effort is normal.  Skin:    General: Skin is dry.  Neurological:     General: No focal deficit present.     Mental Status: She is alert and oriented to person, place, and time.  Psychiatric:        Mood and Affect: Mood normal.        Behavior: Behavior normal.        Thought Content: Thought content normal.     BP 108/72 (BP Location: Left Arm, Patient Position: Sitting, Cuff Size: Large)   Pulse 64   Temp 97.6 F (36.4 C) (Temporal)   Ht '5\' 1"'$  (1.549 m)   Wt 199 lb (90.3 kg)   SpO2 100%   BMI 37.60 kg/m  Wt Readings from Last 3 Encounters:  04/14/22 199 lb (90.3 kg)  10/14/21 192 lb (87.1 kg)  09/29/21 194 lb (88 kg)       Assessment & Plan:   Problem List Items Addressed This Visit       Other   B12 deficiency   Relevant Orders   Vitamin B12 (Completed)   Elevated LDL cholesterol level   Relevant Orders   Lipid panel (Completed)   Obesity (BMI 30-39.9) - Primary   Relevant Orders   Lipid panel (Completed)   CBC w/Diff (Completed)   Comprehensive metabolic panel (Completed)   Amb Ref to Medical Weight Management   TSH (Completed)   T4, free (Completed)   Other Visit Diagnoses     Paresthesias       Relevant Orders   TSH (Completed)   T4, free (Completed)      In-depth counseling on obesity and hyperlipidemia.  Discussed that she is able to exercise, do yoga, recently did an Ironman throughout the  month.  Discussed muscle mass has most likely increased.  She will continue eating a healthy, low-fat diet.  She would also like a referral to [facility] health weight management which I think would be a good idea to help her focus specifically on weight loss. We discussed that her LDL has been elevated but fortunately she has a good HDL and triglyceride level, with a normal ratio.  She is fasting and would like lipids checked today.  Discussed that I will calculate her 10-year ASCVD risk and follow-up with her.  Visit time 22 minutes in face to face communication with patient and coordination of care, additional 10 minutes spent in record review, coordination or care, ordering tests, communicating/referring to other healthcare professionals, documenting in medical records all on the same day of the visit for total time 32 minutes spent on the visit.   History of B12 and vitamin D deficiencies.  Check labs and follow-up.   I am having Naje Pulkrabek maintain her PreviDent 5000 Booster Plus, Clobetasol Propionate, VITAMIN D PO, Kyleena, fluvoxaMINE, and LORazepam.  No orders of the defined types were placed in this encounter.

## 2022-04-14 NOTE — Patient Instructions (Signed)
Please go downstairs for labs.   Start taking vitamin B12 1,000 mcg daily.   You can take vitamin D 1,000 IUs daily.   Continue exercise and healthy diet.   I have referred you to the [facility] and they will call you to schedule.

## 2022-04-16 NOTE — Progress Notes (Signed)
Please see my note. I recommend B12 injections weekly x 4 weeks.

## 2022-04-19 ENCOUNTER — Other Ambulatory Visit: Payer: Self-pay

## 2022-04-19 DIAGNOSIS — E538 Deficiency of other specified B group vitamins: Secondary | ICD-10-CM

## 2022-04-19 NOTE — Progress Notes (Signed)
8 week lab orders placed

## 2022-05-11 ENCOUNTER — Encounter: Payer: Self-pay | Admitting: Psychiatry

## 2022-05-11 ENCOUNTER — Ambulatory Visit: Payer: BC Managed Care – PPO | Admitting: Psychiatry

## 2022-05-11 DIAGNOSIS — F411 Generalized anxiety disorder: Secondary | ICD-10-CM

## 2022-05-11 DIAGNOSIS — F410 Panic disorder [episodic paroxysmal anxiety] without agoraphobia: Secondary | ICD-10-CM

## 2022-05-11 MED ORDER — FLUVOXAMINE MALEATE 100 MG PO TABS: 100.0000 mg | ORAL_TABLET | Freq: Every day | ORAL | 3 refills | Status: DC

## 2022-05-11 MED ORDER — LORAZEPAM 0.5 MG PO TABS: ORAL_TABLET | ORAL | 0 refills | Status: AC

## 2022-05-11 NOTE — Progress Notes (Signed)
Daisy Singleton [id number] 14-Apr-1990 32 y.o.  Subjective:   Patient ID:  Daisy Singleton is a 32 y.o. (DOB Jun 09, 1990) female.  Chief Complaint:  Chief Complaint  Patient presents with   Follow-up    Anxiety, insomnia    HPI Daisy Singleton presents to the office today for follow-up of anxiety. She reports that she is  "Just busy all the time." She reports anxiety has been manageable. She reports that she had one episode of anxiety in response to a trigger. She reports that in retrospect she should have taken a Lorazepam prn. She reports that Luvox has been helpful for anxiety. Very rare intrusive thoughts. Denies depressed mood. Sleeping well. She reports that she sleeps 8-9 hours on average.  Appetite has been ok. Energy has been good. Motivation is good- "I think I have been over-scheduling myself." She is working 2 jobs and is exercising regularly. She reports concentration is not as good at the end of the week and attributes this to doing multiple jobs. Denies SI.   She reports that move at work went "just fine." She reports that work has allowed her to take breaks as needed.   She is hosting a Management consultant. She was going to have a partner help and partner backed out.   Has not needed Lorazepam prn recently.   Past Psychiatric Medication Trials: Luvox Fluoxetine Cymbalta Lorazepam  PHQ2-9    Flowsheet Row Office Visit from 10/14/2021 in [facility] Visit from 01/05/2020 in [place] Visit from 12/04/2018 in [place] Visit from 08/17/2017 in [place] Visit from 02/11/2015 in [facility] Total Score 0 0 0 0 0        Review of Systems:  Review of Systems  Musculoskeletal:  Negative for gait problem.  Neurological:        Reports tingling in fingers resolved with Vit b12 supplementation  Psychiatric/Behavioral:         Please refer to HPI     Medications: I have reviewed the patient's current medications.  Current Outpatient Medications  Medication Sig Dispense Refill   cyanocobalamin (VITAMIN B12) 1000 MCG tablet Take 1,000 mcg by mouth daily.     levonorgestrel (KYLEENA) 19.5 MG IUD Kyleena 17.5 mcg/24 hrs (44yrs) 19.5mg  intrauterine device  [facility]     PREVIDENT 5000 BOOSTER PLUS 1.1 % PSTE Toothpaste qhs  0   VITAMIN D PO Take by mouth. OTC 500u daily     Clobetasol Propionate 0.05 % shampoo As needed. (Patient not taking: Reported on 04/14/2022)     fluvoxaMINE (LUVOX) 100 MG tablet Take 1 tablet (100 mg total) by mouth at bedtime. 90 tablet 3   LORazepam (ATIVAN) 0.5 MG tablet Take 1 tablet at bedtime and 1/2-1 tab as needed for anxiety. 60 tablet 0   No current facility-administered medications for this visit.    Medication Side Effects: None  Allergies:  Allergies  Allergen Reactions   Cefdinir Diarrhea   Latex    Cortisone Hives and Other (See Comments)    Red flush    Past Medical History:  Diagnosis Date   Allergy    Anxiety and depression    Asthma    Dyspareunia, female    Elevated LDL cholesterol level    History of DVT of lower extremity    History of HPV infection    LEEP in the past   Interstitial cystitis  Psoriasis    Seasonal allergies    Vitamin B12 deficiency    Vitamin D deficiency     Past Medical History, Surgical history, Social history, and Family history were reviewed and updated as appropriate.   Please see review of systems for further details on the patient's review from today.   Objective:   Physical Exam:  There were no vitals taken for this visit.  Physical Exam  Lab Review:     Component Value Date/Time   NA 138 04/14/2022 1112   NA 137 04/01/2020 0919   K 4.1 04/14/2022 1112   CL 104 04/14/2022 1112   CO2 26 04/14/2022 1112   GLUCOSE 80 04/14/2022 1112   BUN 11 04/14/2022 1112   BUN 9 04/01/2020 0919   CREATININE 0.87 04/14/2022 1112    CREATININE 0.80 09/25/2013 1626   CALCIUM 9.6 04/14/2022 1112   PROT 7.0 04/14/2022 1112   PROT 6.9 04/01/2020 0919   ALBUMIN 4.1 04/14/2022 1112   ALBUMIN 4.4 04/01/2020 0919   AST 17 04/14/2022 1112   ALT 16 04/14/2022 1112   ALKPHOS 22 (L) 04/14/2022 1112   BILITOT 0.5 04/14/2022 1112   BILITOT 0.3 04/01/2020 0919   GFRNONAA 102 04/01/2020 0919   GFRAA 118 04/01/2020 0919       Component Value Date/Time   WBC 5.9 04/14/2022 1112   RBC 4.63 04/14/2022 1112   HGB 13.7 04/14/2022 1112   HGB 13.3 04/01/2020 0919   HCT 41.7 04/14/2022 1112   HCT 39.9 04/01/2020 0919   PLT 245.0 04/14/2022 1112   PLT 278 04/01/2020 0919   MCV 90.0 04/14/2022 1112   MCV 88 04/01/2020 0919   MCH 29.3 04/01/2020 0919   MCH 28.8 09/25/2013 1643   MCHC 32.9 04/14/2022 1112   RDW 13.8 04/14/2022 1112   RDW 12.7 04/01/2020 0919   LYMPHSABS 2.2 04/14/2022 1112   LYMPHSABS 1.7 04/01/2020 0919   MONOABS 0.4 04/14/2022 1112   EOSABS 0.1 04/14/2022 1112   EOSABS 0.0 04/01/2020 0919   BASOSABS 0.1 04/14/2022 1112   BASOSABS 0.1 04/01/2020 0919    No results found for: "POCLITH", "LITHIUM"   No results found for: "PHENYTOIN", "PHENOBARB", "VALPROATE", "CBMZ"   .res Assessment: Plan:   Pt seen for 30 minutes and time spent discussing response to change in work environment. She reports that she has adjusted well to change in work environment and that her sleep has normalized. Discussed her questions related to long-term safety of Luvox.  Will conitnue Luvox 100 mg po qhs for anxiety.  She has not used Lorazepam prn recently. Will send script to be put on file.  Pt to follow-up in 9 months or sooner if clinically indicated.  Patient advised to contact office with any questions, adverse effects, or acute worsening in signs and symptoms.   [name] was seen today for follow-up.  Diagnoses and all orders for this visit:  Generalized anxiety disorder -     fluvoxaMINE (LUVOX) 100 MG tablet; Take 1  tablet (100 mg total) by mouth at bedtime. -     LORazepam (ATIVAN) 0.5 MG tablet; Take 1 tablet at bedtime and 1/2-1 tab as needed for anxiety.  Panic disorder -     fluvoxaMINE (LUVOX) 100 MG tablet; Take 1 tablet (100 mg total) by mouth at bedtime. -     LORazepam (ATIVAN) 0.5 MG tablet; Take 1 tablet at bedtime and 1/2-1 tab as needed for anxiety.     Please see After Visit Summary for  patient specific instructions.  Future Appointments  Date Time Provider [place]  06/16/2022  4:30 PM [facility] None  07/18/2022  1:30 PM Ward Givens, NP [facility] None  02/22/2023  4:00 PM Thayer Headings, PMHNP [facility] None    No orders of the defined types were placed in this encounter.   -------------------------------

## 2022-06-16 ENCOUNTER — Other Ambulatory Visit (INDEPENDENT_AMBULATORY_CARE_PROVIDER_SITE_OTHER): Payer: BC Managed Care – PPO

## 2022-06-16 DIAGNOSIS — E538 Deficiency of other specified B group vitamins: Secondary | ICD-10-CM | POA: Diagnosis not present

## 2022-06-19 LAB — VITAMIN B12: Vitamin B-12: 441 pg/mL (ref 211–911)

## 2022-07-05 ENCOUNTER — Encounter (INDEPENDENT_AMBULATORY_CARE_PROVIDER_SITE_OTHER): Payer: BC Managed Care – PPO | Admitting: Adult Health

## 2022-07-13 ENCOUNTER — Encounter: Payer: Self-pay | Admitting: Adult Health

## 2022-07-13 NOTE — Telephone Encounter (Signed)
Please call pt to reschedule her appt to a later date.

## 2022-07-14 NOTE — Telephone Encounter (Signed)
Called pt. She answered the phone and then the call was disconnected. Called pt back and went to VM. LVM to please call the office.

## 2022-07-17 ENCOUNTER — Encounter: Payer: Self-pay | Admitting: *Deleted

## 2022-07-17 DIAGNOSIS — Z0289 Encounter for other administrative examinations: Secondary | ICD-10-CM

## 2022-07-18 ENCOUNTER — Ambulatory Visit (INDEPENDENT_AMBULATORY_CARE_PROVIDER_SITE_OTHER): Payer: BC Managed Care – PPO | Admitting: Adult Health

## 2022-07-18 ENCOUNTER — Telehealth: Payer: BC Managed Care – PPO | Admitting: Adult Health

## 2022-07-18 ENCOUNTER — Encounter (INDEPENDENT_AMBULATORY_CARE_PROVIDER_SITE_OTHER): Payer: Self-pay | Admitting: Adult Health

## 2022-07-18 DIAGNOSIS — E785 Hyperlipidemia, unspecified: Secondary | ICD-10-CM | POA: Diagnosis not present

## 2022-07-18 DIAGNOSIS — Z6837 Body mass index (BMI) 37.0-37.9, adult: Secondary | ICD-10-CM

## 2022-07-18 NOTE — Progress Notes (Signed)
Office: [phone]  /  Fax: [phone]   Initial Visit  Daisy Singleton was seen in clinic today to evaluate for obesity. She is interested in losing weight to improve overall health and reduce the risk of weight related complications. She presents today to review program treatment options, initial physical assessment, and evaluation.     She was referred by: PCP  When asked what else they would like to accomplish? She states: Lose a target amount of weight : No specific run, rather fell like she can run easier. and Other: Ensure that she is eating appropriate nutrition needs  Weight history: She reports gaining weight since puberty.  She shares that her family members are overweight/obese.  When asked how has your weight affected you? She states: Contributed to medical problems- HLD  Some associated conditions: Hyperlipidemia and Vitamin D Deficiency  Contributing factors: Family history  Weight promoting medications identified: Contraceptives or hormonal therapy  Current nutrition plan: Other: Eating when hungry and "intuitive eating".  Current level of physical activity: Walking, Running / Jogging, and Strength training  Current or previous pharmacotherapy: None  Response to medication: Never tried medications   Past medical history includes:   Past Medical History:  Diagnosis Date   Allergy    Anxiety and depression    Asthma    Dyspareunia, female    Elevated LDL cholesterol level    History of DVT of lower extremity    History of HPV infection    LEEP in the past   Interstitial cystitis    Psoriasis    Seasonal allergies    Vitamin B12 deficiency    Vitamin D deficiency      Objective:   BP 113/76   Pulse (!) 105   Temp 99.8 F (37.7 C)   Ht 5\' 1"  (1.549 m)   Wt 196 lb (88.9 kg)   SpO2 97%   BMI 37.03 kg/m  She was weighed on the bioimpedance scale: Body mass index is 37.03 kg/m.  Peak Weight:212- around 2017 , Body Fat%:41.3, Visceral Fat  Rating:9, Weight trend over the last 12 months: Unchanged  General:  Alert, oriented and cooperative. Patient is in no acute distress.  Respiratory: Normal respiratory effort, no problems with respiration noted   Gait: able to ambulate independently  Mental Status: Normal mood and affect. Normal behavior. Normal judgment and thought content.   DIAGNOSTIC DATA REVIEWED:  BMET    Component Value Date/Time   NA 138 04/14/2022 1112   NA 137 04/01/2020 0919   K 4.1 04/14/2022 1112   CL 104 04/14/2022 1112   CO2 26 04/14/2022 1112   GLUCOSE 80 04/14/2022 1112   BUN 11 04/14/2022 1112   BUN 9 04/01/2020 0919   CREATININE 0.87 04/14/2022 1112   CREATININE 0.80 09/25/2013 1626   CALCIUM 9.6 04/14/2022 1112   GFRNONAA 102 04/01/2020 0919   GFRAA 118 04/01/2020 0919   Lab Results  Component Value Date   HGBA1C 5.5 10/14/2021   HGBA1C 5.1 12/04/2018   No results found for: "INSULIN" CBC    Component Value Date/Time   WBC 5.9 04/14/2022 1112   RBC 4.63 04/14/2022 1112   HGB 13.7 04/14/2022 1112   HGB 13.3 04/01/2020 0919   HCT 41.7 04/14/2022 1112   HCT 39.9 04/01/2020 0919   PLT 245.0 04/14/2022 1112   PLT 278 04/01/2020 0919   MCV 90.0 04/14/2022 1112   MCV 88 04/01/2020 0919   MCH 29.3 04/01/2020 0919   MCH 28.8 09/25/2013  1643   MCHC 32.9 04/14/2022 1112   RDW 13.8 04/14/2022 1112   RDW 12.7 04/01/2020 0919   Iron/TIBC/Ferritin/ %Sat    Component Value Date/Time   IRON 114 04/01/2020 0919   TIBC 298 04/01/2020 0919   FERRITIN 128 04/01/2020 0919   IRONPCTSAT 38 04/01/2020 0919   Lipid Panel     Component Value Date/Time   CHOL 235 (H) 04/14/2022 1112   CHOL 226 (H) 01/06/2020 0833   TRIG 141.0 04/14/2022 1112   HDL 50.20 04/14/2022 1112   HDL 49 01/06/2020 0833   CHOLHDL 5 04/14/2022 1112   VLDL 28.2 04/14/2022 1112   LDLCALC 156 (H) 04/14/2022 1112   LDLCALC 151 (H) 01/06/2020 0833   Hepatic Function Panel     Component Value Date/Time   PROT 7.0  04/14/2022 1112   PROT 6.9 04/01/2020 0919   ALBUMIN 4.1 04/14/2022 1112   ALBUMIN 4.4 04/01/2020 0919   AST 17 04/14/2022 1112   ALT 16 04/14/2022 1112   ALKPHOS 22 (L) 04/14/2022 1112   BILITOT 0.5 04/14/2022 1112   BILITOT 0.3 04/01/2020 0919      Component Value Date/Time   TSH 1.18 04/14/2022 1112     Assessment and Plan:   Morbid obesity (HCC), Starting BMI 37.1  Hyperlipidemia LDL goal <100   Establish with HWW     Obesity Treatment / Action Plan:  Patient will work on garnering support from family and friends to begin weight loss journey. Will work on eliminating or reducing the presence of highly palatable, calorie dense foods in the home. Will complete provided nutritional and psychosocial assessment questionnaire before the next appointment. Will be scheduled for indirect calorimetry to determine resting energy expenditure in a fasting state.  This will allow [place] to create a reduced calorie, high-protein meal plan to promote loss of fat mass while preserving muscle mass. Counseled on the health benefits of losing 5%-15% of total body weight. Was counseled on nutritional approaches to weight loss and benefits of reducing processed foods and consuming plant-based foods and high quality protein as part of nutritional weight management. Was counseled on pharmacotherapy and role as an adjunct in weight management.   Obesity Education Performed Today:  She was weighed on the bioimpedance scale and results were discussed and documented in the synopsis.  We discussed obesity as a disease and the importance of a more detailed evaluation of all the factors contributing to the disease.  We discussed the importance of long term lifestyle changes which include nutrition, exercise and behavioral modifications as well as the importance of customizing this to her specific health and social needs.  We discussed the benefits of reaching a healthier weight to alleviate the symptoms  of existing conditions and reduce the risks of the biomechanical, metabolic and psychological effects of obesity.  Ellyce Lafevers appears to be in the action stage of change and states they are ready to start intensive lifestyle modifications and behavioral modifications.  30 minutes was spent today on this visit including the above counseling, pre-visit chart review, and post-visit documentation.  Reviewed by clinician on day of visit: allergies, medications, problem list, medical history, surgical history, family history, social history, and previous encounter notes pertinent to obesity diagnosis.   Mendi Constable d. Lorin Hauck, NP-C

## 2022-07-18 NOTE — Addendum Note (Signed)
Addended by: William Hamburger D on: 07/18/2022 04:21 PM   Modules accepted: Level of Service

## 2022-07-19 NOTE — Telephone Encounter (Signed)
Pt is scheduled 07-24-2022 at 1130.  Mychart VV.

## 2022-07-20 ENCOUNTER — Encounter: Payer: Self-pay | Admitting: *Deleted

## 2022-07-20 NOTE — Progress Notes (Signed)
PATIENT: Daisy Singleton DOB: November 11, 1990  REASON FOR VISIT: follow up HISTORY FROM: patient PRIMARY NEUROLOGIST:   Virtual Visit via Video Note  I connected with Lynnae January on 07/24/22 at 11:30 AM EDT by a video enabled telemedicine application located remotely at [facility] and verified that I am speaking with the correct person using two identifiers who was located at their own home.   I discussed the limitations of evaluation and management by telemedicine and the availability of in person appointments. The patient expressed understanding and agreed to proceed.   PATIENT: Daisy Singleton DOB: 01/15/1991  REASON FOR VISIT: follow up HISTORY FROM: patient  HISTORY OF PRESENT ILLNESS: Today 07/24/22:  [name] is a 32 y.o. female with a history of OSA on CPAP. Returns today for follow-up. Reports that CPA is working well but at times it feels hard to breathe out.  She states ultimately she would like to get off of the CPAP machine.  She is going to weight management.  Her download is below       REVIEW OF SYSTEMS: Out of a complete 14 system review of symptoms, the patient complains only of the following symptoms, and all other reviewed systems are negative.  ALLERGIES: Allergies  Allergen Reactions   Cefdinir Diarrhea   Latex    Cortisone Hives and Other (See Comments)    Red flush    HOME MEDICATIONS: Outpatient Medications Prior to Visit  Medication Sig Dispense Refill   Clobetasol Propionate 0.05 % shampoo As needed.     cyanocobalamin (VITAMIN B12) 1000 MCG tablet Take 1,000 mcg by mouth daily.     fluvoxaMINE (LUVOX) 100 MG tablet Take 1 tablet (100 mg total) by mouth at bedtime. 90 tablet 3   levonorgestrel (KYLEENA) 19.5 MG IUD Kyleena 17.5 mcg/24 hrs (31yrs) 19.5mg  intrauterine device  [facility]     LORazepam (ATIVAN) 0.5 MG tablet Take 1 tablet at bedtime and 1/2-1 tab as needed for anxiety. 60 tablet 0    PREVIDENT 5000 BOOSTER PLUS 1.1 % PSTE Toothpaste qhs  0   VITAMIN D PO Take by mouth. OTC 500u daily     No facility-administered medications prior to visit.    PAST MEDICAL HISTORY: Past Medical History:  Diagnosis Date   Allergy    Anxiety and depression    Asthma    Dyspareunia, female    Elevated LDL cholesterol level    History of DVT of lower extremity    History of HPV infection    LEEP in the past   Interstitial cystitis    Psoriasis    Seasonal allergies    Vitamin B12 deficiency    Vitamin D deficiency     PAST SURGICAL HISTORY: Past Surgical History:  Procedure Laterality Date   CERVICAL BIOPSY  W/ LOOP ELECTRODE EXCISION     FOOT SURGERY      FAMILY HISTORY: Family History  Problem Relation Age of Onset   Asthma Mother    Hyperlipidemia Mother    Hypertension Mother    Hyperlipidemia Father    Hypertension Father    Psoriasis Father    Cleft lip Sister    Cleft palate Sister    Hypertension Sister    Hyperlipidemia Sister     SOCIAL HISTORY: Social History   Socioeconomic History   Marital status: Married    Spouse name: Not on file   Number of children: Not on file   Years of education: Not  on file   Highest education level: Not on file  Occupational History   Not on file  Tobacco Use   Smoking status: Never   Smokeless tobacco: Never  Vaping Use   Vaping Use: Never used  Substance and Sexual Activity   Alcohol use: Yes    Comment: 1 drink 3-4 nights per week    Drug use: No   Sexual activity: Yes    Partners: Male    Birth control/protection: Implant  Other Topics Concern   Not on file  Social History Narrative   Caffeine: occastional.  Education: masters.  Working: Field seismologist, married.  No kids.    Social Determinants of Health   Financial Resource Strain: Not on file  Food Insecurity: Not on file  Transportation Needs: Not on file  Physical Activity: Not on file  Stress: Not on file  Social Connections: Not on file  Intimate  Partner Violence: Not on file      PHYSICAL EXAM Generalized: Well developed, in no acute distress   Neurological examination  Mentation: Alert oriented to time, place, history taking. Follows all commands speech and language fluent Cranial nerve II-XII: Facial symmetry noted DIAGNOSTIC DATA (LABS, IMAGING, TESTING) - I reviewed patient records, labs, notes, testing and imaging myself where available.  Lab Results  Component Value Date   WBC 5.9 04/14/2022   HGB 13.7 04/14/2022   HCT 41.7 04/14/2022   MCV 90.0 04/14/2022   PLT 245.0 04/14/2022      Component Value Date/Time   NA 138 04/14/2022 1112   NA 137 04/01/2020 0919   K 4.1 04/14/2022 1112   CL 104 04/14/2022 1112   CO2 26 04/14/2022 1112   GLUCOSE 80 04/14/2022 1112   BUN 11 04/14/2022 1112   BUN 9 04/01/2020 0919   CREATININE 0.87 04/14/2022 1112   CREATININE 0.80 09/25/2013 1626   CALCIUM 9.6 04/14/2022 1112   PROT 7.0 04/14/2022 1112   PROT 6.9 04/01/2020 0919   ALBUMIN 4.1 04/14/2022 1112   ALBUMIN 4.4 04/01/2020 0919   AST 17 04/14/2022 1112   ALT 16 04/14/2022 1112   ALKPHOS 22 (L) 04/14/2022 1112   BILITOT 0.5 04/14/2022 1112   BILITOT 0.3 04/01/2020 0919   GFRNONAA 102 04/01/2020 0919   GFRAA 118 04/01/2020 0919   Lab Results  Component Value Date   CHOL 235 (H) 04/14/2022   HDL 50.20 04/14/2022   LDLCALC 156 (H) 04/14/2022   TRIG 141.0 04/14/2022   CHOLHDL 5 04/14/2022   Lab Results  Component Value Date   HGBA1C 5.5 10/14/2021   Lab Results  Component Value Date   VITAMINB12 441 06/16/2022   Lab Results  Component Value Date   TSH 1.18 04/14/2022      ASSESSMENT AND PLAN 32 y.o. year old female  has a past medical history of Allergy, Anxiety and depression, Asthma, Dyspareunia, female, Elevated LDL cholesterol level, History of DVT of lower extremity, History of HPV infection, Interstitial cystitis, Psoriasis, Seasonal allergies, Vitamin B12 deficiency, and Vitamin D deficiency.  here with:  OSA on CPAP  CPAP compliance excellent Residual AHI is good Encouraged patient to continue using CPAP nightly and > 4 hours each night Will increase EPR to 3.  Advised to call if she continues to have trouble exhaling F/U in 1 year or sooner if needed    Butch Penny, MSN, NP-C 07/24/2022, 10:54 AM [facility] [address], [address] [place], [place] [address] [phone]

## 2022-07-24 ENCOUNTER — Telehealth: Payer: BC Managed Care – PPO | Admitting: Adult Health

## 2022-07-24 DIAGNOSIS — G4733 Obstructive sleep apnea (adult) (pediatric): Secondary | ICD-10-CM

## 2022-08-22 ENCOUNTER — Encounter (INDEPENDENT_AMBULATORY_CARE_PROVIDER_SITE_OTHER): Payer: Self-pay

## 2022-08-22 ENCOUNTER — Ambulatory Visit (INDEPENDENT_AMBULATORY_CARE_PROVIDER_SITE_OTHER): Payer: BC Managed Care – PPO | Admitting: Family Medicine

## 2022-08-22 NOTE — Progress Notes (Signed)
No show for 1st appt with Doc

## 2022-09-05 ENCOUNTER — Ambulatory Visit (INDEPENDENT_AMBULATORY_CARE_PROVIDER_SITE_OTHER): Payer: BC Managed Care – PPO | Admitting: Family Medicine

## 2022-09-26 ENCOUNTER — Ambulatory Visit (INDEPENDENT_AMBULATORY_CARE_PROVIDER_SITE_OTHER): Payer: BC Managed Care – PPO | Admitting: Family Medicine

## 2022-09-26 ENCOUNTER — Encounter (INDEPENDENT_AMBULATORY_CARE_PROVIDER_SITE_OTHER): Payer: Self-pay | Admitting: Family Medicine

## 2022-10-02 NOTE — Progress Notes (Signed)
No show for appt. 

## 2022-10-17 ENCOUNTER — Ambulatory Visit (INDEPENDENT_AMBULATORY_CARE_PROVIDER_SITE_OTHER): Payer: BC Managed Care – PPO | Admitting: Family Medicine

## 2022-11-08 ENCOUNTER — Encounter (INDEPENDENT_AMBULATORY_CARE_PROVIDER_SITE_OTHER): Payer: Self-pay | Admitting: Family Medicine

## 2022-11-08 ENCOUNTER — Ambulatory Visit (INDEPENDENT_AMBULATORY_CARE_PROVIDER_SITE_OTHER): Payer: BC Managed Care – PPO | Admitting: Family Medicine

## 2022-11-08 VITALS — BP 115/72 | HR 67 | Temp 98.6°F | Ht 61.0 in | Wt 193.0 lb

## 2022-11-08 DIAGNOSIS — E66812 Obesity, class 2: Secondary | ICD-10-CM

## 2022-11-08 DIAGNOSIS — E669 Obesity, unspecified: Secondary | ICD-10-CM

## 2022-11-08 DIAGNOSIS — E785 Hyperlipidemia, unspecified: Secondary | ICD-10-CM | POA: Diagnosis not present

## 2022-11-08 DIAGNOSIS — R0602 Shortness of breath: Secondary | ICD-10-CM | POA: Diagnosis not present

## 2022-11-08 DIAGNOSIS — F419 Anxiety disorder, unspecified: Secondary | ICD-10-CM

## 2022-11-08 DIAGNOSIS — F32A Depression, unspecified: Secondary | ICD-10-CM | POA: Diagnosis not present

## 2022-11-08 DIAGNOSIS — R5383 Other fatigue: Secondary | ICD-10-CM | POA: Diagnosis not present

## 2022-11-08 DIAGNOSIS — E538 Deficiency of other specified B group vitamins: Secondary | ICD-10-CM | POA: Diagnosis not present

## 2022-11-08 DIAGNOSIS — E559 Vitamin D deficiency, unspecified: Secondary | ICD-10-CM

## 2022-11-08 DIAGNOSIS — Z6836 Body mass index (BMI) 36.0-36.9, adult: Secondary | ICD-10-CM

## 2022-11-08 NOTE — Progress Notes (Signed)
Chief Complaint:   OBESITY Daisy Singleton (MR# [id number]) is a 32 y.o. female who presents for evaluation and treatment of obesity and related comorbidities. Current BMI is Body mass index is 36.47 kg/m. Daisy Singleton has been struggling with her weight for many years and has been unsuccessful in either losing weight, maintaining weight loss, or reaching her healthy weight goal.  Daisy Singleton is currently in the action stage of change and ready to dedicate time achieving and maintaining a healthier weight. Daisy Singleton is interested in becoming our patient and working on intensive lifestyle modifications including (but not limited to) diet and exercise for weight loss.  Patient told about clinic from PCP.  She had info appointment in early June.  She does not have plans for babies in the future. 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 She lives at home with her husband Jesusita Oka and he is supportive of her and she isn't anticipating any sabotage from him. She has previously lost weight with increase in exercise and cutting calories.  She is interested in knowing how to fuel her body more effectively. Previous calorie intake was [phone]. She Singleton to cook.   Food Recall: Breakfast is 2 hardboiled eggs or a banana or a serving of yogurt with a 1/4 cup of yogurt.  Weekends she will do a brunch.  She feels satisfied with all options but feels hungry around 10am.  At 10 am she will eat plantain chips or edamame or a spoonful of peanut butter or apple with PB.  Lunch is variable.  She may do leftovers like chicken with broccoli and sweet potato. 1 cup broccoli, 1/2 cup sweet potato and 3-4oz of chicken. Feels satisfied. On Monday, Wednesday, Friday she has a small snack at 5 like a handful of nuts or some chips.  Dinner is meat, vegetable, rice- 1 chicken breast, 1 cup veg, 3/4 cup rice  and feels full.  Singleton to have 2-3 small pieces of choc at night.   Daisy Singleton's habits were reviewed today and are as follows: Her family eats meals together, she started gaining excessive weight during puberty then college, her heaviest weight ever was 230 pounds, she is frequently drinking liquids with calories, she frequently eats larger portions than normal, and she struggles with emotional eating.  Depression Screen Geraldy's Food and Mood (modified PHQ-9) score was 2.  Subjective:   1. Other fatigue Daisy Singleton admits to daytime somnolence and admits to waking up still tired. Patient has a history of symptoms of daytime fatigue. Shardea generally gets 6 or 9 hours of sleep per night, and states that she has generally restful sleep. Snoring is not present. Apneic episodes are not present. Epworth Sleepiness Score is 1.   2. SOBOE (shortness of breath on exertion) Daisy Singleton notes increasing shortness of breath with exercising and seems to be worsening over time with weight gain. She notes getting out of breath sooner with activity than she used to. This has not gotten worse recently. Totiana denies shortness of breath at rest or orthopnea.  3. Hyperlipidemia LDL goal <100 Patient's last LDL was 156, HDL 50.2, and triglycerides 141.  She is not on medications.  She lost weight previously with an increase in LDL.  4. Vitamin B12 deficiency Patient is on a OTC vitamin B12 daily, and she notes fatigue.  5. Vitamin D  deficiency Patient's last lab was normal at 75 one year ago.  She is on OTC vitamin D.  6. Anxiety and depression Patient is on Luvox daily.  She denies suicidal or homicidal ideations.  Her symptoms are extremely well-managed.  Assessment/Plan:   1. Other fatigue Daisy Singleton does feel that her weight is causing her energy to be lower than it should be. Fatigue may be related to obesity, depression or many other causes. Labs will be ordered, and in the meanwhile, Daisy Singleton will focus on self care  including making healthy food choices, increasing physical activity and focusing on stress reduction.  - EKG 12-Lead - Folate - Hemoglobin A1c - Insulin, random - TSH - T4, free - T3 - Comprehensive metabolic panel  2. SOBOE (shortness of breath on exertion) Daisy Singleton does feel that she gets out of breath more easily that she used to when she exercises. Daisy Singleton's shortness of breath appears to be obesity related and exercise induced. She has agreed to work on weight loss and gradually increase exercise to treat her exercise induced shortness of breath. Will continue to monitor closely.  - CBC with Differential/Platelet  3. Hyperlipidemia LDL goal <100 We will check labs today, and we will follow-up at patient's next appointment.  - Lipid Panel With LDL/HDL Ratio  4. Vitamin B12 deficiency We will check labs today, and we will follow-up at patient's next appointment.  - Vitamin B12  5. Vitamin D deficiency We will check labs today, and we will follow-up at patient's next appointment.  - VITAMIN D 25 Hydroxy (Vit-D Deficiency, Fractures)  6. Anxiety and depression We will follow-up on patient's symptoms at her next appointment.  7. BMI 36.0-36.9,adult  8. Obesity with starting BMI of 36.5 Daisy Singleton is currently in the action stage of change and her goal is to continue with weight loss efforts. I recommend Daisy Singleton begin the structured treatment plan as follows:  She has agreed to the Category 3 Plan wit 8 oz of protein.  Exercise goals: As is.    Behavioral modification strategies: increasing lean protein intake, meal planning and cooking strategies, keeping healthy foods in the home, and planning for success.  She was informed of the importance of frequent follow-up visits to maximize her success with intensive lifestyle modifications for her multiple health conditions. She was informed we would discuss her lab results at her next visit unless there is a critical issue that needs  to be addressed sooner. Daisy Singleton agreed to keep her next visit at the agreed upon time to discuss these results.  Objective:   Blood pressure 115/72, pulse 67, temperature 98.6 F (37 C), height 5\' 1"  (1.549 m), weight 193 lb (87.5 kg), SpO2 98%. Body mass index is 36.47 kg/m.  EKG: Normal sinus rhythm, rate 67 BPM.  Indirect Calorimeter completed today shows a VO2 of 220 and a REE of 1512.  Her calculated basal metabolic rate is 4401 thus her basal metabolic rate is worse than expected.  General: Cooperative, alert, well developed, in no acute distress. HEENT: Conjunctivae and lids unremarkable. Cardiovascular: Regular rhythm.  Lungs: Normal work of breathing. Neurologic: No focal deficits.   Lab Results  Component Value Date   CREATININE 0.79 11/08/2022   BUN 9 11/08/2022   NA 140 11/08/2022   K 4.4 11/08/2022   CL 105 11/08/2022   CO2 18 (L) 11/08/2022   Lab Results  Component Value Date   ALT 12 11/08/2022   AST 17 11/08/2022   ALKPHOS 29 (L) 11/08/2022  BILITOT 0.4 11/08/2022   Lab Results  Component Value Date   HGBA1C 5.5 10/14/2021   HGBA1C 5.1 12/04/2018   Lab Results  Component Value Date   INSULIN 9.5 11/08/2022   Lab Results  Component Value Date   TSH 1.230 11/08/2022   Lab Results  Component Value Date   CHOL 193 11/08/2022   HDL 38 (L) 11/08/2022   LDLCALC 133 (H) 11/08/2022   TRIG 121 11/08/2022   CHOLHDL 5 04/14/2022   Lab Results  Component Value Date   WBC 5.9 04/14/2022   HGB 13.7 04/14/2022   HCT 41.7 04/14/2022   MCV 90.0 04/14/2022   PLT 245.0 04/14/2022   Lab Results  Component Value Date   IRON 114 04/01/2020   TIBC 298 04/01/2020   FERRITIN 128 04/01/2020   Attestation Statements:   Reviewed by clinician on day of visit: allergies, medications, problem list, medical history, surgical history, family history, social history, and previous encounter notes.  Time spent on visit including pre-visit chart review and  post-visit charting and care was 40 minutes.   I, Burt Knack, am acting as transcriptionist for Daisy Likes, MD.  This is the patient's first visit at [facility]. The patient's NEW PATIENT PACKET was reviewed at length. Included in the packet: current and past health history, medications, allergies, ROS, gynecologic history (women only), surgical history, family history, social history, weight history, weight loss surgery history (for those that have had weight loss surgery), nutritional evaluation, mood and food questionnaire, PHQ9, Epworth questionnaire, sleep habits questionnaire, patient life and health improvement goals questionnaire. These will all be scanned into the patient's chart under media.   During the visit, I independently reviewed the patient's EKG, bioimpedance scale results, and indirect calorimeter results. I used this information to tailor a meal plan for the patient that will help her to lose weight and will improve her obesity-related conditions going forward. I performed a medically necessary appropriate examination and/or evaluation. I discussed the assessment and treatment plan with the patient. The patient was provided an opportunity to ask questions and all were answered. The patient agreed with the plan and demonstrated an understanding of the instructions. Labs were ordered at this visit and will be reviewed at the next visit unless more critical results need to be addressed immediately. Clinical information was updated and documented in the EMR.    I have reviewed the above documentation for accuracy and completeness, and I agree with the above. - Daisy Likes, MD

## 2022-11-09 LAB — LIPID PANEL WITH LDL/HDL RATIO
Cholesterol, Total: 193 mg/dL (ref 100–199)
HDL: 38 mg/dL — ABNORMAL LOW (ref >39)
LDL Chol Calc (NIH): 133 mg/dL — ABNORMAL HIGH (ref 0–99)
LDL/HDL Ratio: 3.5 {ratio} — ABNORMAL HIGH (ref 0.0–3.2)
Triglycerides: 121 mg/dL (ref 0–149)
VLDL Cholesterol Cal: 22 mg/dL (ref 5–40)

## 2022-11-09 LAB — COMPREHENSIVE METABOLIC PANEL
ALT: 12 [IU]/L (ref 0–32)
AST: 17 [IU]/L (ref 0–40)
Albumin: 4.4 g/dL (ref 3.9–4.9)
Alkaline Phosphatase: 29 [IU]/L — ABNORMAL LOW (ref 44–121)
BUN/Creatinine Ratio: 11 (ref 9–23)
BUN: 9 mg/dL (ref 6–20)
Bilirubin Total: 0.4 mg/dL (ref 0.0–1.2)
CO2: 18 mmol/L — ABNORMAL LOW (ref 20–29)
Calcium: 9 mg/dL (ref 8.7–10.2)
Chloride: 105 mmol/L (ref 96–106)
Creatinine, Ser: 0.79 mg/dL (ref 0.57–1.00)
Globulin, Total: 2.6 g/dL (ref 1.5–4.5)
Glucose: 80 mg/dL (ref 70–99)
Potassium: 4.4 mmol/L (ref 3.5–5.2)
Sodium: 140 mmol/L (ref 134–144)
Total Protein: 7 g/dL (ref 6.0–8.5)
eGFR: 101 mL/min/{1.73_m2} (ref >59)

## 2022-11-09 LAB — TSH: TSH: 1.23 u[IU]/mL (ref 0.450–4.500)

## 2022-11-09 LAB — INSULIN, RANDOM: INSULIN: 9.5 u[IU]/mL (ref 2.6–24.9)

## 2022-11-09 LAB — VITAMIN B12: Vitamin B-12: 965 pg/mL (ref 232–1245)

## 2022-11-09 LAB — T4, FREE: Free T4: 1.13 ng/dL (ref 0.82–1.77)

## 2022-11-09 LAB — VITAMIN D 25 HYDROXY (VIT D DEFICIENCY, FRACTURES): Vit D, 25-Hydroxy: 35.8 ng/mL (ref 30.0–100.0)

## 2022-11-09 LAB — FOLATE: Folate: 14.7 ng/mL (ref >3.0)

## 2022-11-09 LAB — T3: T3, Total: 127 ng/dL (ref 71–180)

## 2022-11-13 ENCOUNTER — Encounter (INDEPENDENT_AMBULATORY_CARE_PROVIDER_SITE_OTHER): Payer: Self-pay | Admitting: Family Medicine

## 2022-11-13 NOTE — Telephone Encounter (Signed)
Please advise 

## 2022-11-22 ENCOUNTER — Ambulatory Visit (INDEPENDENT_AMBULATORY_CARE_PROVIDER_SITE_OTHER): Payer: BC Managed Care – PPO | Admitting: Family Medicine

## 2022-11-22 ENCOUNTER — Encounter (INDEPENDENT_AMBULATORY_CARE_PROVIDER_SITE_OTHER): Payer: Self-pay | Admitting: Family Medicine

## 2022-11-22 VITALS — BP 122/82 | HR 66 | Temp 98.3°F | Ht 61.0 in | Wt 192.0 lb

## 2022-11-22 DIAGNOSIS — E7849 Other hyperlipidemia: Secondary | ICD-10-CM | POA: Diagnosis not present

## 2022-11-22 DIAGNOSIS — K5909 Other constipation: Secondary | ICD-10-CM

## 2022-11-22 DIAGNOSIS — E559 Vitamin D deficiency, unspecified: Secondary | ICD-10-CM

## 2022-11-22 DIAGNOSIS — E669 Obesity, unspecified: Secondary | ICD-10-CM

## 2022-11-22 DIAGNOSIS — E88819 Insulin resistance, unspecified: Secondary | ICD-10-CM

## 2022-11-22 DIAGNOSIS — Z6836 Body mass index (BMI) 36.0-36.9, adult: Secondary | ICD-10-CM

## 2022-11-22 NOTE — Telephone Encounter (Signed)
Please advise 

## 2022-11-22 NOTE — Progress Notes (Signed)
Chief Complaint:   OBESITY Daisy Singleton is here to discuss her progress with her obesity treatment plan along with follow-up of her obesity related diagnoses. Daisy Singleton is on the Category 3 Plan + 8 oz of protein at dinner and states she is following her eating plan approximately 95% of the time. Daisy Singleton states she is doing yoga, biking, and swimming 5 times per week.    Today's visit was #: 2 Starting weight: 193 lbs Starting date: 11/08/2022 Today's weight: 192 lbs Today's date: 11/22/2022 Total lbs lost to date: 1 Total lbs lost since last in-office visit: 1  Interim History: Patient presents for first follow up.  Felt that the food was very significant in quantity.  She made a list of thoughts.  Felt like recovery from her exercise is much faster.  She is ok with cutting out milk.  Wants to incorporate bananas.  Wants to know if she can substitute deli meat for leftover meat.  She felt very constipated from increase in protein.  Wants to switch bread type.  What is long term transition of this plan?  She kept list of deviations from plan.   Subjective:   1. Other hyperlipidemia Patient's recent LDL was 133, HDL 38, and triglycerides 409.  She is not on medications, and has low risk age exclusions.  2. Other constipation Patient did try apple juice and then had black coffee which did not get her bowels moving.  3. Vitamin D deficiency Patient is on vitamin D 500 IU daily.  Her vitamin D level was of 35.8, and she notes fatigue.  4. Insulin resistance Patient has minimally elevated insulin level and A1c controlled on her last check.  Assessment/Plan:   1. Other hyperlipidemia Patient will continue her Category 3 plan with no change in meal plan.  2. Other constipation Patient is to increase her total water intake and add MiraLAX daily for at least 2 weeks daily to create consistency.  3. Vitamin D deficiency Patient is to restart OTC vitamin D that she has at home.  4. Insulin  resistance Pathophysiology of insulin resistance was discussed today.  Patient agrees to continue to follow her Category 3 plan and keep mindfulness of snack calories.  5. BMI 36.0-36.9,adult  6. Obesity with starting BMI of 36.5 Daisy Singleton is currently in the action stage of change. As such, her goal is to continue with weight loss efforts. She has agreed to the Category 3 Plan + 8 oz of protein at dinner.   Exercise goals: As is.   Behavioral modification strategies: increasing lean protein intake, meal planning and cooking strategies, better snacking choices, and planning for success.  Daisy Singleton has agreed to follow-up with our clinic in 2 to 3 weeks. She was informed of the importance of frequent follow-up visits to maximize her success with intensive lifestyle modifications for her multiple health conditions.   Objective:   Blood pressure 122/82, pulse 66, temperature 98.3 F (36.8 C), height 5\' 1"  (1.549 m), weight 192 lb (87.1 kg), last menstrual period 11/15/2022, SpO2 100%. Body mass index is 36.28 kg/m.  General: Cooperative, alert, well developed, in no acute distress. HEENT: Conjunctivae and lids unremarkable. Cardiovascular: Regular rhythm.  Lungs: Normal work of breathing. Neurologic: No focal deficits.   Lab Results  Component Value Date   CREATININE 0.79 11/08/2022   BUN 9 11/08/2022   NA 140 11/08/2022   K 4.4 11/08/2022   CL 105 11/08/2022   CO2 18 (L) 11/08/2022   Lab  Results  Component Value Date   ALT 12 11/08/2022   AST 17 11/08/2022   ALKPHOS 29 (L) 11/08/2022   BILITOT 0.4 11/08/2022   Lab Results  Component Value Date   HGBA1C 5.5 10/14/2021   HGBA1C 5.1 12/04/2018   Lab Results  Component Value Date   INSULIN 9.5 11/08/2022   Lab Results  Component Value Date   TSH 1.230 11/08/2022   Lab Results  Component Value Date   CHOL 193 11/08/2022   HDL 38 (L) 11/08/2022   LDLCALC 133 (H) 11/08/2022   TRIG 121 11/08/2022   CHOLHDL 5 04/14/2022    Lab Results  Component Value Date   VD25OH 35.8 11/08/2022   VD25OH 75.55 10/14/2021   VD25OH 15.8 (L) 04/01/2020   Lab Results  Component Value Date   WBC 5.9 04/14/2022   HGB 13.7 04/14/2022   HCT 41.7 04/14/2022   MCV 90.0 04/14/2022   PLT 245.0 04/14/2022   Lab Results  Component Value Date   IRON 114 04/01/2020   TIBC 298 04/01/2020   FERRITIN 128 04/01/2020   Attestation Statements:   Reviewed by clinician on day of visit: allergies, medications, problem list, medical history, surgical history, family history, social history, and previous encounter notes.  Time spent on visit including pre-visit chart review and post-visit care and charting was 45 minutes.   I, Burt Knack, am acting as transcriptionist for Reuben Likes, MD.  I have reviewed the above documentation for accuracy and completeness, and I agree with the above. - Reuben Likes, MD

## 2022-11-24 ENCOUNTER — Encounter: Payer: Self-pay | Admitting: Family Medicine

## 2022-11-24 ENCOUNTER — Telehealth (INDEPENDENT_AMBULATORY_CARE_PROVIDER_SITE_OTHER): Payer: BC Managed Care – PPO | Admitting: Family Medicine

## 2022-11-24 DIAGNOSIS — E782 Mixed hyperlipidemia: Secondary | ICD-10-CM

## 2022-11-24 NOTE — Progress Notes (Signed)
MyChart Video Visit    Virtual Visit via Video Note    Patient location: Home. Patient and provider in visit Provider location: Office  I discussed the limitations of evaluation and management by telemedicine and the availability of in person appointments. The patient expressed understanding and agreed to proceed.  Visit Date: 11/24/2022  Today's healthcare provider: Hetty Blend, NP-C     Subjective:    Patient ID: Daisy Singleton, female    DOB: 01-May-1990, 32 y.o.   MRN: [id number]  No chief complaint on file.   HPI  She would like for me to read you her most recent lipid panel and compare it with the 1 we did in March.  Questions regarding the change in HDL and LDL. She only recently started working with [facility].  She is making healthy diet changes.  Exercising regularly but doing more yoga as opposed to high intensity that she was doing earlier this year.  Past Medical History:  Diagnosis Date   Allergy    Anxiety and depression    Asthma    Dyspareunia, female    Elevated LDL cholesterol level    High cholesterol    History of DVT of lower extremity    History of HPV infection    LEEP in the past   Interstitial cystitis    Psoriasis    Seasonal allergies    Vitamin B12 deficiency    Vitamin D deficiency     Past Surgical History:  Procedure Laterality Date   CERVICAL BIOPSY  W/ LOOP ELECTRODE EXCISION     FOOT SURGERY      Family History  Problem Relation Age of Onset   Asthma Mother    Hyperlipidemia Mother    Hypertension Mother    Obesity Mother    Hyperlipidemia Father    Hypertension Father    Psoriasis Father    Cleft lip Sister    Cleft palate Sister    Hypertension Sister    Hyperlipidemia Sister     Social History   Socioeconomic History   Marital status: Married    Spouse name: Not on file   Number of children: Not on file   Years of education: Not on file   Highest education level: Not on file   Occupational History   Occupation: Government social research officer  Tobacco Use   Smoking status: Never   Smokeless tobacco: Never  Vaping Use   Vaping status: Never Used  Substance and Sexual Activity   Alcohol use: Yes    Comment: 1 drink 3-4 nights per week    Drug use: No   Sexual activity: Yes    Partners: Male    Birth control/protection: Implant  Other Topics Concern   Not on file  Social History Narrative   Caffeine: Fish farm manager.  Education: masters.  Working: Field seismologist, married.  No kids.    Social Determinants of Health   Financial Resource Strain: Not on file  Food Insecurity: Not on file  Transportation Needs: Not on file  Physical Activity: Not on file  Stress: Not on file  Social Connections: Not on file  Intimate Partner Violence: Not on file    Outpatient Medications Prior to Visit  Medication Sig Dispense Refill   Clobetasol Propionate 0.05 % shampoo As needed.     cyanocobalamin (VITAMIN B12) 1000 MCG tablet Take 1,000 mcg by mouth daily.     fluvoxaMINE (LUVOX) 100 MG tablet Take 1 tablet (100 mg total) by mouth  at bedtime. 90 tablet 3   levonorgestrel (KYLEENA) 19.5 MG IUD Kyleena 17.5 mcg/24 hrs (29yrs) 19.5mg  intrauterine device  [facility]     LORazepam (ATIVAN) 0.5 MG tablet Take 1 tablet at bedtime and 1/2-1 tab as needed for anxiety. 60 tablet 0   PREVIDENT 5000 BOOSTER PLUS 1.1 % PSTE Toothpaste qhs  0   VITAMIN D PO Take by mouth. OTC 500u daily     No facility-administered medications prior to visit.    Allergies  Allergen Reactions   Cefdinir Diarrhea   Latex    Cortisone Hives and Other (See Comments)    Red flush    ROS     Objective:    Physical Exam  LMP 11/15/2022  Wt Readings from Last 3 Encounters:  11/22/22 192 lb (87.1 kg)  11/08/22 193 lb (87.5 kg)  07/18/22 196 lb (88.9 kg)   Alert and oriented and in no acute distress.  Respirations unlabored.  Normal mood.    Assessment & Plan:   Problem List Items Addressed This  Visit   None Visit Diagnoses     Mixed hyperlipidemia    -  Primary      Unclear as to why her HDL dropped significantly between March and 2 weeks ago.  She will continue working with [facility] and increase high intensity exercise. Calculated ASCVD 10 yr risk at 1.0%  I am having Daisy Singleton maintain her PreviDent 5000 Booster Plus, Clobetasol Propionate, VITAMIN D PO, Kyleena, cyanocobalamin, fluvoxaMINE, and LORazepam.  No orders of the defined types were placed in this encounter.   I discussed the assessment and treatment plan with the patient. The patient was provided an opportunity to ask questions and all were answered. The patient agreed with the plan and demonstrated an understanding of the instructions.   The patient was advised to call back or seek an in-person evaluation if the symptoms worsen or if the condition fails to improve as anticipated.     Hetty Blend, NP-C [facility] [phone] (phone) [phone] (fax)  [facility]

## 2022-11-24 NOTE — Telephone Encounter (Signed)
Appt booked

## 2022-11-24 NOTE — Telephone Encounter (Signed)
Pt wanting feedback in regards to her labs from [facility] as they are different then her labs w you earlier this year. Just wants to make sure she is headed in the right direction, please advise

## 2022-11-27 NOTE — Telephone Encounter (Signed)
Please advise 

## 2022-12-06 ENCOUNTER — Ambulatory Visit (INDEPENDENT_AMBULATORY_CARE_PROVIDER_SITE_OTHER): Payer: BC Managed Care – PPO | Admitting: Family Medicine

## 2022-12-06 ENCOUNTER — Encounter (INDEPENDENT_AMBULATORY_CARE_PROVIDER_SITE_OTHER): Payer: Self-pay | Admitting: Family Medicine

## 2022-12-06 VITALS — BP 104/60 | HR 58 | Temp 98.6°F | Ht 61.0 in | Wt 191.0 lb

## 2022-12-06 DIAGNOSIS — E669 Obesity, unspecified: Secondary | ICD-10-CM | POA: Diagnosis not present

## 2022-12-06 DIAGNOSIS — Z6836 Body mass index (BMI) 36.0-36.9, adult: Secondary | ICD-10-CM | POA: Diagnosis not present

## 2022-12-06 DIAGNOSIS — E66812 Obesity, class 2: Secondary | ICD-10-CM

## 2022-12-06 DIAGNOSIS — E7849 Other hyperlipidemia: Secondary | ICD-10-CM

## 2022-12-06 DIAGNOSIS — E559 Vitamin D deficiency, unspecified: Secondary | ICD-10-CM

## 2022-12-06 NOTE — Telephone Encounter (Signed)
Please advise 

## 2022-12-06 NOTE — Progress Notes (Signed)
Chief Complaint:   OBESITY Daisy Singleton is here to discuss her progress with her obesity treatment plan along with follow-up of her obesity related diagnoses. Daisy Singleton is on the Category 3 Plan with 8 oz of protein at dinner and states she is following her eating plan approximately 90% of the time. Daisy Singleton states she is doing yoga and Crossfit 2-5 timer per week.    Today's visit was #: 3 Starting weight: 193 lbs Starting date: 11/08/2022 Today's weight: 191 lbs Today's date: 12/06/2022 Total lbs lost to date: 2 Total lbs lost since last in-office visit: 1  Interim History: Since last appointment patient started crossfit again.  She has been also working.  She is really enjoying Crossfit and did it previously for about a year.  She started feeling hungry again about 3 days ago.  She isn't having issues eating all her meals and has to spread them out particularly in dinner.  She continues to feel impressed with how easy and quickly her body is recovering after exercise.  She was a little concerned that she was taking in too much sodium but her BP has been fine. She moved her parents into her grandmother's house and is feeling excited that this will be almost complete.  Constipation has worked itself off.   Subjective:   1. Other hyperlipidemia Patient's last LDL was 133, HDL 38, and triglycerides 161. She is not on medications.   2. Vitamin D deficiency Patient is not on prescription Vitamin D, but she is on OTC Vitamin D 5,000 IU daily. She notes fatigue.   Assessment/Plan:   1. Other hyperlipidemia We will repeat labs in February.   2. Vitamin D deficiency Patient was encouraged to continue OTC Vitamin D 5,000 IU daily.  3. BMI 36.0-36.9,adult  4. Obesity with starting BMI of 36.5 Daisy Singleton is currently in the action stage of change. As such, her goal is to continue with weight loss efforts. She has agreed to the Category 3 Plan.   Exercise goals: As is.   Behavioral modification  strategies: increasing lean protein intake, meal planning and cooking strategies, keeping healthy foods in the home, and planning for success.  Daisy Singleton has agreed to follow-up with our clinic in 3 to 4 weeks. She was informed of the importance of frequent follow-up visits to maximize her success with intensive lifestyle modifications for her multiple health conditions.   Objective:   Blood pressure 104/60, pulse (!) 58, temperature 98.6 F (37 C), height 5\' 1"  (1.549 m), weight 191 lb (86.6 kg), last menstrual period 11/15/2022, SpO2 99%. Body mass index is 36.09 kg/m.  General: Cooperative, alert, well developed, in no acute distress. HEENT: Conjunctivae and lids unremarkable. Cardiovascular: Regular rhythm.  Lungs: Normal work of breathing. Neurologic: No focal deficits.   Lab Results  Component Value Date   CREATININE 0.79 11/08/2022   BUN 9 11/08/2022   NA 140 11/08/2022   K 4.4 11/08/2022   CL 105 11/08/2022   CO2 18 (L) 11/08/2022   Lab Results  Component Value Date   ALT 12 11/08/2022   AST 17 11/08/2022   ALKPHOS 29 (L) 11/08/2022   BILITOT 0.4 11/08/2022   Lab Results  Component Value Date   HGBA1C 5.5 10/14/2021   HGBA1C 5.1 12/04/2018   Lab Results  Component Value Date   INSULIN 9.5 11/08/2022   Lab Results  Component Value Date   TSH 1.230 11/08/2022   Lab Results  Component Value Date   CHOL 193  11/08/2022   HDL 38 (L) 11/08/2022   LDLCALC 133 (H) 11/08/2022   TRIG 121 11/08/2022   CHOLHDL 5 04/14/2022   Lab Results  Component Value Date   VD25OH 35.8 11/08/2022   VD25OH 75.55 10/14/2021   VD25OH 15.8 (L) 04/01/2020   Lab Results  Component Value Date   WBC 5.9 04/14/2022   HGB 13.7 04/14/2022   HCT 41.7 04/14/2022   MCV 90.0 04/14/2022   PLT 245.0 04/14/2022   Lab Results  Component Value Date   IRON 114 04/01/2020   TIBC 298 04/01/2020   FERRITIN 128 04/01/2020   Attestation Statements:   Reviewed by clinician on day of visit:  allergies, medications, problem list, medical history, surgical history, family history, social history, and previous encounter notes.  Time spent on visit including pre-visit chart review and post-visit care and charting was 30 minutes.   I, Burt Knack, am acting as transcriptionist for Reuben Likes, MD.  I have reviewed the above documentation for accuracy and completeness, and I agree with the above. - Reuben Likes, MD

## 2022-12-07 ENCOUNTER — Ambulatory Visit (INDEPENDENT_AMBULATORY_CARE_PROVIDER_SITE_OTHER): Payer: BC Managed Care – PPO | Admitting: Family Medicine

## 2022-12-20 ENCOUNTER — Encounter: Payer: Self-pay | Admitting: Psychiatry

## 2022-12-21 ENCOUNTER — Ambulatory Visit (INDEPENDENT_AMBULATORY_CARE_PROVIDER_SITE_OTHER): Payer: BC Managed Care – PPO | Admitting: Family Medicine

## 2022-12-21 ENCOUNTER — Encounter (INDEPENDENT_AMBULATORY_CARE_PROVIDER_SITE_OTHER): Payer: Self-pay | Admitting: Family Medicine

## 2022-12-21 VITALS — BP 103/69 | HR 62 | Temp 98.1°F | Ht 61.0 in | Wt 188.0 lb

## 2022-12-21 DIAGNOSIS — E669 Obesity, unspecified: Secondary | ICD-10-CM | POA: Diagnosis not present

## 2022-12-21 DIAGNOSIS — Z6835 Body mass index (BMI) 35.0-35.9, adult: Secondary | ICD-10-CM

## 2022-12-21 DIAGNOSIS — E559 Vitamin D deficiency, unspecified: Secondary | ICD-10-CM | POA: Diagnosis not present

## 2022-12-21 DIAGNOSIS — E66812 Obesity, class 2: Secondary | ICD-10-CM

## 2022-12-21 NOTE — Progress Notes (Signed)
   SUBJECTIVE:  Chief Complaint: Obesity  Interim History: Patient has been doing some meditation on muscle.  She wants to know if she able to lift the same and do the same intensity of activity.  She has been feeling less than stellar the last week.  She wants to commit more consistently to the Category 3.  She has shifted to more yogurt.  She is starting to feel appropriate hunger and is only struggling with the last two bites of dinner. For  Thanksgiving she is seeing her family and her husband's family.  She is traveling for Christmas which she is a bit nervous about.   Daisy Singleton is here to discuss her progress with her obesity treatment plan. She is on the Category 3 Plan and states she is following her eating plan approximately 90 % of the time. She states she is exercising 1-2 hours 7 times per week.   OBJECTIVE: Visit Diagnoses: Problem List Items Addressed This Visit       Other   Vitamin D deficiency - Primary    Discussed importance of vitamin d supplementation.  Vitamin d supplementation has been shown to decrease fatigue, decrease risk of progression to insulin resistance and then prediabetes, decreases risk of falling in older age and can even assist in decreasing depressive symptoms in PTSD.   Patient on OTC Vitamin D.         Vitals Temp: 98.1 F (36.7 C) BP: 103/69 Pulse Rate: 62 SpO2: 97 %   Anthropometric Measurements Height: 5\' 1"  (1.549 m) Weight: 188 lb (85.3 kg) BMI (Calculated): 35.54 Weight at Last Visit: 191 lb Weight Lost Since Last Visit: 3 Weight Gained Since Last Visit: 0 Starting Weight: 193 lb Total Weight Loss (lbs): 5 lb (2.268 kg)   Body Composition  Body Fat %: 39.5 % Fat Mass (lbs): 74.4 lbs Muscle Mass (lbs): 108.2 lbs Total Body Water (lbs): 77.8 lbs Visceral Fat Rating : 9   Other Clinical Data Today's Visit #: 4 Starting Date: 11/08/22     ASSESSMENT AND PLAN:  Diet: Daisy Singleton is currently in the action stage of change. As  such, her goal is to continue with weight loss efforts. She has agreed to Category 3 Plan  with option of Pescatarian plan +300 calories.  Exercise: Daisy Singleton has been instructed to continue exercising as is for weight loss and overall health benefits.   Behavior Modification:  We discussed the following Behavioral Modification Strategies today: increasing lean protein intake, increasing vegetables, meal planning and cooking strategies, keeping healthy foods in the home, and holiday eating strategies.   No follow-ups on file.Marland Kitchen She was informed of the importance of frequent follow up visits to maximize her success with intensive lifestyle modifications for her multiple health conditions.  Attestation Statements:   Reviewed by clinician on day of visit: allergies, medications, problem list, medical history, surgical history, family history, social history, and previous encounter notes.   Daisy Likes, MD

## 2022-12-21 NOTE — Assessment & Plan Note (Signed)
Discussed importance of vitamin d supplementation.  Vitamin d supplementation has been shown to decrease fatigue, decrease risk of progression to insulin resistance and then prediabetes, decreases risk of falling in older age and can even assist in decreasing depressive symptoms in PTSD.   Patient on OTC Vitamin D.

## 2022-12-22 ENCOUNTER — Other Ambulatory Visit: Payer: Self-pay | Admitting: Family Medicine

## 2022-12-22 DIAGNOSIS — Z1231 Encounter for screening mammogram for malignant neoplasm of breast: Secondary | ICD-10-CM

## 2023-01-11 ENCOUNTER — Ambulatory Visit (INDEPENDENT_AMBULATORY_CARE_PROVIDER_SITE_OTHER): Payer: BC Managed Care – PPO | Admitting: Family Medicine

## 2023-01-18 ENCOUNTER — Encounter (INDEPENDENT_AMBULATORY_CARE_PROVIDER_SITE_OTHER): Payer: Self-pay | Admitting: Family Medicine

## 2023-01-18 ENCOUNTER — Ambulatory Visit (INDEPENDENT_AMBULATORY_CARE_PROVIDER_SITE_OTHER): Payer: BC Managed Care – PPO | Admitting: Family Medicine

## 2023-01-18 VITALS — BP 115/73 | HR 73 | Temp 98.3°F | Ht 61.0 in | Wt 186.0 lb

## 2023-01-18 DIAGNOSIS — F32A Depression, unspecified: Secondary | ICD-10-CM

## 2023-01-18 DIAGNOSIS — Z6835 Body mass index (BMI) 35.0-35.9, adult: Secondary | ICD-10-CM

## 2023-01-18 DIAGNOSIS — E66812 Obesity, class 2: Secondary | ICD-10-CM

## 2023-01-18 DIAGNOSIS — E559 Vitamin D deficiency, unspecified: Secondary | ICD-10-CM | POA: Diagnosis not present

## 2023-01-18 DIAGNOSIS — E669 Obesity, unspecified: Secondary | ICD-10-CM | POA: Diagnosis not present

## 2023-01-18 DIAGNOSIS — F419 Anxiety disorder, unspecified: Secondary | ICD-10-CM | POA: Diagnosis not present

## 2023-01-18 NOTE — Assessment & Plan Note (Signed)
Patient is on OTC vitamin d and she is taking a gummy option.  She will change over to tablet or capsule to avoid additional sweeteners.  Repeat labs in February

## 2023-01-18 NOTE — Progress Notes (Signed)
   SUBJECTIVE:  Chief Complaint: Obesity  Interim History: Patient had a great Thanksgiving.  She did not have any stomach upset from any indulgences.  She went back on plan after the holiday.  She is going to try to maintain over the next 4 weeks. She has many holiday events, activities coming up.  She has been doing yogurt, cheese stick option for breakfast and lunch is often a sandwich.  She is saving a large portion of food for dinner and after dinner and is wondering if that is an issue. She mentions she has many different activities she is planning to eat what is available and goal is to follow plan when she can.   Daisy Singleton is here to discuss her progress with her obesity treatment plan. She is on the Category 3 Plan and states she is following her eating plan approximately 87 % of the time. She states she is exercising 60 minutes 6 times per week.   OBJECTIVE: Visit Diagnoses: Problem List Items Addressed This Visit       Other   Anxiety and depression   Patient reports this is the hardest time of the year for her given the darkness.  She denies homicidal and suicidal ideation.  She enjoys the cold weather.  She is on luvox and ativan.  No medication changes recently.      Vitamin D deficiency - Primary   Patient is on OTC vitamin d and she is taking a gummy option.  She will change over to tablet or capsule to avoid additional sweeteners.  Repeat labs in February       Vitals Temp: 98.3 F (36.8 C) BP: 115/73 Pulse Rate: 73 SpO2: 99 %   Anthropometric Measurements Height: 5\' 1"  (1.549 m) Weight: 186 lb (84.4 kg) BMI (Calculated): 35.16 Weight at Last Visit: 188 lb Weight Lost Since Last Visit: 2 Weight Gained Since Last Visit: 0 Starting Weight: 193 lb Total Weight Loss (lbs): 7 lb (3.175 kg)   Body Composition  Body Fat %: 39.2 % Fat Mass (lbs): 73 lbs Muscle Mass (lbs): 107.4 lbs Total Body Water (lbs): 77.6 lbs Visceral Fat Rating : 8   Other Clinical  Data Today's Visit #: 5 Starting Date: 11/08/22     ASSESSMENT AND PLAN:  Diet: Daisy Singleton is currently in the action stage of change. As such, her goal is to continue with weight loss efforts. She has agreed to Category 3 Plan.  Exercise: Daisy Singleton has been instructed to continue exercising as is for weight loss and overall health benefits.   Behavior Modification:  We discussed the following Behavioral Modification Strategies today: increasing lean protein intake, increasing vegetables, meal planning and cooking strategies, holiday eating strategies, and planning for success. We discussed various medication options to help Daisy Singleton with her weight loss efforts.  No follow-ups on file.Marland Kitchen She was informed of the importance of frequent follow up visits to maximize her success with intensive lifestyle modifications for her multiple health conditions.  Attestation Statements:   Reviewed by clinician on day of visit: allergies, medications, problem list, medical history, surgical history, family history, social history, and previous encounter notes.   Daisy Likes, MD

## 2023-01-18 NOTE — Assessment & Plan Note (Signed)
Patient reports this is the hardest time of the year for her given the darkness.  She denies homicidal and suicidal ideation.  She enjoys the cold weather.  She is on luvox and ativan.  No medication changes recently.

## 2023-02-15 ENCOUNTER — Ambulatory Visit (INDEPENDENT_AMBULATORY_CARE_PROVIDER_SITE_OTHER): Payer: 59 | Admitting: Family Medicine

## 2023-02-15 VITALS — BP 112/74 | HR 68 | Temp 98.4°F | Ht 61.0 in | Wt 185.0 lb

## 2023-02-15 DIAGNOSIS — E7800 Pure hypercholesterolemia, unspecified: Secondary | ICD-10-CM | POA: Diagnosis not present

## 2023-02-15 DIAGNOSIS — E669 Obesity, unspecified: Secondary | ICD-10-CM

## 2023-02-15 DIAGNOSIS — F411 Generalized anxiety disorder: Secondary | ICD-10-CM

## 2023-02-15 DIAGNOSIS — E66812 Obesity, class 2: Secondary | ICD-10-CM

## 2023-02-15 DIAGNOSIS — Z6834 Body mass index (BMI) 34.0-34.9, adult: Secondary | ICD-10-CM | POA: Diagnosis not present

## 2023-02-15 DIAGNOSIS — F410 Panic disorder [episodic paroxysmal anxiety] without agoraphobia: Secondary | ICD-10-CM

## 2023-02-15 MED ORDER — FLUVOXAMINE MALEATE 100 MG PO TABS: 100.0000 mg | ORAL_TABLET | Freq: Every day | ORAL | 3 refills | Status: AC

## 2023-02-15 NOTE — Progress Notes (Signed)
   SUBJECTIVE:  Chief Complaint: Obesity  Interim History: Patient had a great holiday season.  She did not follow the meal plan and she wasn't home.  She got back on plan when she got back.  She had a hard time getting all the food in when she got back.  She had not started exercising at that point.  She has since started exercising. She is planning a small get together for her birthday.  She is feeling very pleased with her ability and stamina in terms of her activity recently.   Daisy Singleton is here to discuss her progress with her obesity treatment plan. She is on the Category 3 Plan and states she is following her eating plan approximately 50 % of the time. She states she is exercising infrequently over the break, but is back to exercising.   OBJECTIVE: Visit Diagnoses: Problem List Items Addressed This Visit   None   No data recorded No data recorded No data recorded No data recorded   ASSESSMENT AND PLAN:  Diet: Daisy Singleton is currently in the action stage of change. As such, her goal is to continue with weight loss efforts. She has agreed to Category 3 Plan.  Exercise: Daisy Singleton has been instructed that some exercise is better than none for weight loss and overall health benefits.   Behavior Modification:  We discussed the following Behavioral Modification Strategies today: increasing lean protein intake, increasing vegetables, meal planning and cooking strategies, and better snacking choices.   No follow-ups on file.Daisy Singleton She was informed of the importance of frequent follow up visits to maximize her success with intensive lifestyle modifications for her multiple health conditions.  Attestation Statements:   Reviewed by clinician on day of visit: allergies, medications, problem list, medical history, surgical history, family history, social history, and previous encounter notes.      Daisy Cho, MD

## 2023-02-15 NOTE — Assessment & Plan Note (Signed)
 Patient last LDL 133.  She is not on medication. Last labs done in October.  Needs repeat labs in April.

## 2023-02-15 NOTE — Assessment & Plan Note (Signed)
 Patient doing well on luvox.  She has been stable on this medication.  Her psychiatrist recently moved companies and so she needs to find a new psychiatrist.

## 2023-02-22 ENCOUNTER — Ambulatory Visit: Payer: BC Managed Care – PPO | Admitting: Psychiatry

## 2023-03-22 ENCOUNTER — Ambulatory Visit (INDEPENDENT_AMBULATORY_CARE_PROVIDER_SITE_OTHER): Payer: 59 | Admitting: Family Medicine

## 2023-03-22 ENCOUNTER — Encounter (INDEPENDENT_AMBULATORY_CARE_PROVIDER_SITE_OTHER): Payer: Self-pay | Admitting: Family Medicine

## 2023-03-22 VITALS — BP 111/65 | HR 71 | Temp 98.5°F | Ht 61.0 in | Wt 182.0 lb

## 2023-03-22 DIAGNOSIS — E88819 Insulin resistance, unspecified: Secondary | ICD-10-CM | POA: Diagnosis not present

## 2023-03-22 DIAGNOSIS — E7849 Other hyperlipidemia: Secondary | ICD-10-CM

## 2023-03-22 DIAGNOSIS — E538 Deficiency of other specified B group vitamins: Secondary | ICD-10-CM

## 2023-03-22 DIAGNOSIS — Z6836 Body mass index (BMI) 36.0-36.9, adult: Secondary | ICD-10-CM

## 2023-03-22 DIAGNOSIS — N3010 Interstitial cystitis (chronic) without hematuria: Secondary | ICD-10-CM | POA: Diagnosis not present

## 2023-03-22 DIAGNOSIS — E66812 Obesity, class 2: Secondary | ICD-10-CM

## 2023-03-22 DIAGNOSIS — Z6834 Body mass index (BMI) 34.0-34.9, adult: Secondary | ICD-10-CM

## 2023-03-22 DIAGNOSIS — E559 Vitamin D deficiency, unspecified: Secondary | ICD-10-CM | POA: Diagnosis not present

## 2023-03-22 DIAGNOSIS — E669 Obesity, unspecified: Secondary | ICD-10-CM

## 2023-03-22 NOTE — Assessment & Plan Note (Signed)
Patient has a history of recurrent interstitial cystitis.  She mentions she normally controls this with her food and liquid intake.  She does not need any intervention at this point.  Follow up on symptoms at next appointment.

## 2023-03-22 NOTE — Progress Notes (Signed)
   SUBJECTIVE:  Chief Complaint: Obesity  Interim History: Patient has had a difficult month of January- her arthritis has been flaring up, her psoriasis has been bad and her bladder has been acting up.  She did have a feeling of adverse effects from eating more indulgently.  She has decided that after feeling that way she won't be eating that way again.  She has been putting a significant amount of energy into her exercising to help counter her angst.   Daisy Singleton is here to discuss her progress with her obesity treatment plan. She is on the Category 3 Plan and states she is following her eating plan approximately 80 % of the time. She states she is exercising 60 minutes 6 times per week.   OBJECTIVE: Visit Diagnoses: Problem List Items Addressed This Visit       Endocrine   Insulin resistance   Elevated insulin on previous labs.  Repeating fasting labs today and will plan to discuss at next appointment.  Patient to continue current plan.      Relevant Orders   Comprehensive metabolic panel (Completed)   Hemoglobin A1c (Completed)   Insulin, random (Completed)     Genitourinary   Interstitial cystitis - Primary   Patient has a history of recurrent interstitial cystitis.  She mentions she normally controls this with her food and liquid intake.  She does not need any intervention at this point.  Follow up on symptoms at next appointment.        Other   Vitamin D deficiency   Relevant Orders   VITAMIN D 25 Hydroxy (Vit-D Deficiency, Fractures) (Completed)   Vitamin B12 deficiency   B12 below goal previously.  She is currently on supplemental B12.  Repeat B12 level today.      Relevant Orders   Vitamin B12 (Completed)   Other hyperlipidemia   Previously elevated LDL.  Patient has been consistently focused on her nutrition and getting adequate protein and thus limiting saturated fats.  Repeat FLP today.      Relevant Orders   Lipid Panel With LDL/HDL Ratio (Completed)    Vitals:   03/22/23 0700  BP: 111/65  Pulse: 71  Temp: 98.5 F (36.9 C)  SpO2: 98%    No data recorded  No data recorded  No data recorded  No data recorded    ASSESSMENT AND PLAN:  Diet: Daisy Singleton is currently in the action stage of change. As such, her goal is to continue with weight loss efforts. She has agreed to Category 3 Plan.  Exercise: Daisy Singleton has been instructed to work up to a goal of 150 minutes of combined cardio and strengthening exercise per week for weight loss and overall health benefits.   Behavior Modification:  We discussed the following Behavioral Modification Strategies today: increasing lean protein intake, increasing vegetables, meal planning and cooking strategies, and planning for success.   No follow-ups on file.Marland Kitchen She was informed of the importance of frequent follow up visits to maximize her success with intensive lifestyle modifications for her multiple health conditions.  Attestation Statements:   Reviewed by clinician on day of visit: allergies, medications, problem list, medical history, surgical history, family history, social history, and previous encounter notes.      Reuben Likes, MD

## 2023-03-23 ENCOUNTER — Encounter (INDEPENDENT_AMBULATORY_CARE_PROVIDER_SITE_OTHER): Payer: Self-pay | Admitting: Family Medicine

## 2023-03-23 LAB — HEMOGLOBIN A1C
Est. average glucose Bld gHb Est-mCnc: 114 mg/dL
Hgb A1c MFr Bld: 5.6 % (ref 4.8–5.6)

## 2023-03-23 LAB — COMPREHENSIVE METABOLIC PANEL
ALT: 14 [IU]/L (ref 0–32)
AST: 14 [IU]/L (ref 0–40)
Albumin: 4.4 g/dL (ref 3.9–4.9)
Alkaline Phosphatase: 31 [IU]/L — ABNORMAL LOW (ref 44–121)
BUN/Creatinine Ratio: 18 (ref 9–23)
BUN: 15 mg/dL (ref 6–20)
Bilirubin Total: 0.4 mg/dL (ref 0.0–1.2)
CO2: 21 mmol/L (ref 20–29)
Calcium: 9.1 mg/dL (ref 8.7–10.2)
Chloride: 103 mmol/L (ref 96–106)
Creatinine, Ser: 0.82 mg/dL (ref 0.57–1.00)
Globulin, Total: 2.2 g/dL (ref 1.5–4.5)
Glucose: 89 mg/dL (ref 70–99)
Potassium: 4.5 mmol/L (ref 3.5–5.2)
Sodium: 139 mmol/L (ref 134–144)
Total Protein: 6.6 g/dL (ref 6.0–8.5)
eGFR: 96 mL/min/{1.73_m2} (ref >59)

## 2023-03-23 LAB — LIPID PANEL WITH LDL/HDL RATIO
Cholesterol, Total: 221 mg/dL — ABNORMAL HIGH (ref 100–199)
HDL: 49 mg/dL (ref >39)
LDL Chol Calc (NIH): 154 mg/dL — ABNORMAL HIGH (ref 0–99)
LDL/HDL Ratio: 3.1 {ratio} (ref 0.0–3.2)
Triglycerides: 98 mg/dL (ref 0–149)
VLDL Cholesterol Cal: 18 mg/dL (ref 5–40)

## 2023-03-23 LAB — VITAMIN B12: Vitamin B-12: 1133 pg/mL (ref 232–1245)

## 2023-03-23 LAB — INSULIN, RANDOM: INSULIN: 10.4 u[IU]/mL (ref 2.6–24.9)

## 2023-03-23 LAB — VITAMIN D 25 HYDROXY (VIT D DEFICIENCY, FRACTURES): Vit D, 25-Hydroxy: 60.4 ng/mL (ref 30.0–100.0)

## 2023-03-26 NOTE — Telephone Encounter (Signed)
 Please advise

## 2023-04-01 DIAGNOSIS — E7849 Other hyperlipidemia: Secondary | ICD-10-CM | POA: Insufficient documentation

## 2023-04-01 DIAGNOSIS — E88819 Insulin resistance, unspecified: Secondary | ICD-10-CM | POA: Insufficient documentation

## 2023-04-01 NOTE — Assessment & Plan Note (Signed)
 Previously elevated LDL.  Patient has been consistently focused on her nutrition and getting adequate protein and thus limiting saturated fats.  Repeat FLP today.

## 2023-04-01 NOTE — Assessment & Plan Note (Signed)
 Elevated insulin on previous labs.  Repeating fasting labs today and will plan to discuss at next appointment.  Patient to continue current plan.

## 2023-04-01 NOTE — Assessment & Plan Note (Signed)
 B12 below goal previously.  She is currently on supplemental B12.  Repeat B12 level today.

## 2023-04-04 ENCOUNTER — Encounter (INDEPENDENT_AMBULATORY_CARE_PROVIDER_SITE_OTHER): Payer: Self-pay | Admitting: Family Medicine

## 2023-04-05 NOTE — Telephone Encounter (Signed)
**Note De-identified  [place] Obfuscation** Please advise 

## 2023-04-19 ENCOUNTER — Encounter (INDEPENDENT_AMBULATORY_CARE_PROVIDER_SITE_OTHER): Payer: Self-pay | Admitting: Family Medicine

## 2023-04-19 ENCOUNTER — Ambulatory Visit (INDEPENDENT_AMBULATORY_CARE_PROVIDER_SITE_OTHER): Payer: 59 | Admitting: Family Medicine

## 2023-04-19 VITALS — BP 105/62 | HR 56 | Temp 98.1°F | Ht 61.0 in | Wt 182.0 lb

## 2023-04-19 DIAGNOSIS — E7849 Other hyperlipidemia: Secondary | ICD-10-CM

## 2023-04-19 DIAGNOSIS — Z6834 Body mass index (BMI) 34.0-34.9, adult: Secondary | ICD-10-CM | POA: Diagnosis not present

## 2023-04-19 DIAGNOSIS — E559 Vitamin D deficiency, unspecified: Secondary | ICD-10-CM

## 2023-04-19 DIAGNOSIS — E669 Obesity, unspecified: Secondary | ICD-10-CM | POA: Diagnosis not present

## 2023-04-19 NOTE — Progress Notes (Signed)
   SUBJECTIVE:  Chief Complaint: Obesity  Interim History: Patient is currently in a boot on her right foot due to possible medial plantar fascia tear.  She is feeling frustrated.  She can still swim and do yoga but can't do high intensity activity.  She mentions this month that she was less in control of food intake due to her cholesterol lab results.  She has done very well with planning her meals and getting her nutrition in check when she was logging.   Daisy Singleton is here to discuss her progress with her obesity treatment plan. She is on the 1250 calories and 95+ grams of protein states she is following her eating plan approximately 85 % of the time. She states she is exercising 60 minutes 4-5 times per week.   OBJECTIVE: Visit Diagnoses: Problem List Items Addressed This Visit       Other   Obesity (BMI 30-39.9)   See anthropometric data.  Continue food logging with focus on protein and calorie intake. No changes in goals.      Vitamin D deficiency   Lab at last appointment at goal.  She is doing well on OTC Vitamin D gummies.  Will continue current treatment plan.      Other hyperlipidemia - Primary   Discussed labs done at prior appointment. LDL elevated close to initial labs in 150s.  She has been very mindful of food choices and LDL still elevated.  HDL increased from prior labs and triglycerides lower than prior labs.  Will continue mindful food logging.        Vitals Temp: 98.1 F (36.7 C) BP: 105/62 Pulse Rate: (!) 56 SpO2: 98 %   Anthropometric Measurements Height: 5\' 1"  (1.549 m) Weight: 182 lb (82.6 kg) BMI (Calculated): 34.41 Weight at Last Visit: 182 lb Weight Lost Since Last Visit: 0 Weight Gained Since Last Visit: 0 Starting Weight: 193 lb Total Weight Loss (lbs): 11 lb (4.99 kg)   Body Composition  Body Fat %: 39.6 % Fat Mass (lbs): 72.4 lbs Muscle Mass (lbs): 104.6 lbs Total Body Water (lbs): 78 lbs Visceral Fat Rating : 8   Other Clinical  Data Fasting: no Labs: no Today's Visit #: 8 Starting Date: 11/08/22 Comments: Cat 3     ASSESSMENT AND PLAN:  Diet: Daisy Singleton is currently in the action stage of change. As such, her goal is to continue with weight loss efforts and has agreed to keeping a food journal and adhering to recommended goals of 1250 calories and 90 or more gram of protein daily.   Exercise:  All adults should avoid inactivity. Some activity is better than none, and adults who participate in any amount of physical activity, gain some health benefits.  Behavior Modification:  We discussed the following Behavioral Modification Strategies today: increasing lean protein intake, increasing vegetables, meal planning and cooking strategies, avoiding temptations, planning for success, and keep a strict food journal.   No follow-ups on file.Marland Kitchen She was informed of the importance of frequent follow up visits to maximize her success with intensive lifestyle modifications for her multiple health conditions.  Attestation Statements:   Reviewed by clinician on day of visit: allergies, medications, problem list, medical history, surgical history, family history, social history, and previous encounter notes.    Reuben Likes, MD

## 2023-04-19 NOTE — Assessment & Plan Note (Signed)
 Discussed labs done at prior appointment. LDL elevated close to initial labs in 150s.  She has been very mindful of food choices and LDL still elevated.  HDL increased from prior labs and triglycerides lower than prior labs.  Will continue mindful food logging.

## 2023-04-19 NOTE — Assessment & Plan Note (Signed)
 Lab at last appointment at goal.  She is doing well on OTC Vitamin D gummies.  Will continue current treatment plan.

## 2023-04-19 NOTE — Assessment & Plan Note (Signed)
 See anthropometric data.  Continue food logging with focus on protein and calorie intake. No changes in goals.

## 2023-05-24 ENCOUNTER — Ambulatory Visit (INDEPENDENT_AMBULATORY_CARE_PROVIDER_SITE_OTHER): Payer: 59 | Admitting: Family Medicine

## 2023-06-21 ENCOUNTER — Ambulatory Visit (INDEPENDENT_AMBULATORY_CARE_PROVIDER_SITE_OTHER): Admitting: Family Medicine

## 2023-07-04 ENCOUNTER — Encounter (INDEPENDENT_AMBULATORY_CARE_PROVIDER_SITE_OTHER): Payer: Self-pay

## 2023-07-23 ENCOUNTER — Encounter: Payer: Self-pay | Admitting: *Deleted

## 2023-07-24 ENCOUNTER — Telehealth: Payer: Self-pay | Admitting: Adult Health

## 2023-07-24 ENCOUNTER — Telehealth: Payer: BC Managed Care – PPO | Admitting: Adult Health

## 2023-07-24 DIAGNOSIS — G4733 Obstructive sleep apnea (adult) (pediatric): Secondary | ICD-10-CM | POA: Diagnosis not present

## 2023-07-24 NOTE — Progress Notes (Signed)
 PATIENT: Daisy Singleton DOB: 1990/10/29  REASON FOR VISIT: follow up HISTORY FROM: patient PRIMARY NEUROLOGIST: Dr. Omar Bibber   Virtual Visit via Video Note  I connected with Daisy Singleton on 07/24/23 at 10:45 AM EDT by a video enabled telemedicine application located remotely at [facility] and verified that I am speaking with the correct person using two identifiers who was located at their job in [place].   I discussed the limitations of evaluation and management by telemedicine and the availability of in person appointments. The patient expressed understanding and agreed to proceed.   PATIENT: Daisy Singleton DOB: 08-Apr-1990  REASON FOR VISIT: follow up HISTORY FROM: patient  HISTORY OF PRESENT ILLNESS: Today 07/24/23:  Daisy Singleton is a 33 y.o. female with a history of OSA on CPAP. Returns today for follow-up.  Reports that her CPAP is working well.  She states that her weight is currently down to 185.  However she took a nap without the CPAP and she had someone tell her that she was still snoring.  Her download is below     07/24/22: Daisy Singleton is a 33 y.o. female with a history of OSA on CPAP. Returns today for follow-up. Reports that CPA is working well but at times it feels hard to breathe out.  She states ultimately she would like to get off of the CPAP machine.  She is going to weight management.  Her download is below       REVIEW OF SYSTEMS: Out of a complete 14 system review of symptoms, the patient complains only of the following symptoms, and all other reviewed systems are negative.  ALLERGIES: Allergies  Allergen Reactions   Cefdinir Diarrhea   Latex    Cortisone Hives and Other (See Comments)    Red flush    HOME MEDICATIONS: Outpatient Medications Prior to Visit  Medication Sig Dispense Refill   Clobetasol Propionate 0.05 % shampoo As needed.     cyanocobalamin  (VITAMIN B12) 1000 MCG tablet Take 1,000 mcg by mouth daily.      fluvoxaMINE  (LUVOX ) 100 MG tablet Take 1 tablet (100 mg total) by mouth at bedtime. 90 tablet 3   levonorgestrel (KYLEENA) 19.5 MG IUD Kyleena 17.5 mcg/24 hrs (79yrs) 19.5mg  intrauterine device  [facility]     LORazepam  (ATIVAN ) 0.5 MG tablet Take 1 tablet at bedtime and 1/2-1 tab as needed for anxiety. 60 tablet 0   PREVIDENT 5000 BOOSTER PLUS 1.1 % PSTE Toothpaste qhs  0   VITAMIN D  PO Take by mouth. OTC 500u daily     No facility-administered medications prior to visit.    PAST MEDICAL HISTORY: Past Medical History:  Diagnosis Date   Allergy    Anxiety and depression    Asthma    Dyspareunia, female    Elevated LDL cholesterol level    High cholesterol    History of DVT of lower extremity    History of HPV infection    LEEP in the past   Interstitial cystitis    Psoriasis    Seasonal allergies    Vitamin B12 deficiency    Vitamin D  deficiency     PAST SURGICAL HISTORY: Past Surgical History:  Procedure Laterality Date   CERVICAL BIOPSY  W/ LOOP ELECTRODE EXCISION     FOOT SURGERY      FAMILY HISTORY: Family History  Problem Relation Age of Onset   Asthma Mother    Hyperlipidemia Mother    Hypertension Mother  Obesity Mother    Hyperlipidemia Father    Hypertension Father    Psoriasis Father    Cleft lip Sister    Cleft palate Sister    Hypertension Sister    Hyperlipidemia Sister     SOCIAL HISTORY: Social History   Socioeconomic History   Marital status: Married    Spouse name: Not on file   Number of children: Not on file   Years of education: Not on file   Highest education level: Not on file  Occupational History   Occupation: Government social research officer  Tobacco Use   Smoking status: Never   Smokeless tobacco: Never  Vaping Use   Vaping status: Never Used  Substance and Sexual Activity   Alcohol use: Yes    Comment: 1 drink 3-4 nights per week    Drug use: No   Sexual activity: Yes    Partners: Male    Birth control/protection:  Implant  Other Topics Concern   Not on file  Social History Narrative   Caffeine: Fish farm manager.  Education: masters.  Working: Field seismologist, married.  No kids.    Social Drivers of Corporate investment banker Strain: Not on file  Food Insecurity: Not on file  Transportation Needs: Not on file  Physical Activity: Not on file  Stress: Not on file  Social Connections: Not on file  Intimate Partner Violence: Not on file      PHYSICAL EXAM Generalized: Well developed, in no acute distress   Neurological examination  Mentation: Alert oriented to time, place, history taking. Follows all commands speech and language fluent Cranial nerve II-XII: Facial symmetry noted DIAGNOSTIC DATA (LABS, IMAGING, TESTING) - I reviewed patient records, labs, notes, testing and imaging myself where available.  Lab Results  Component Value Date   WBC 5.9 04/14/2022   HGB 13.7 04/14/2022   HCT 41.7 04/14/2022   MCV 90.0 04/14/2022   PLT 245.0 04/14/2022      Component Value Date/Time   NA 139 03/22/2023 0817   K 4.5 03/22/2023 0817   CL 103 03/22/2023 0817   CO2 21 03/22/2023 0817   GLUCOSE 89 03/22/2023 0817   GLUCOSE 80 04/14/2022 1112   BUN 15 03/22/2023 0817   CREATININE 0.82 03/22/2023 0817   CREATININE 0.80 09/25/2013 1626   CALCIUM 9.1 03/22/2023 0817   PROT 6.6 03/22/2023 0817   ALBUMIN 4.4 03/22/2023 0817   AST 14 03/22/2023 0817   ALT 14 03/22/2023 0817   ALKPHOS 31 (L) 03/22/2023 0817   BILITOT 0.4 03/22/2023 0817   GFRNONAA 102 04/01/2020 0919   GFRAA 118 04/01/2020 0919   Lab Results  Component Value Date   CHOL 221 (H) 03/22/2023   HDL 49 03/22/2023   LDLCALC 154 (H) 03/22/2023   TRIG 98 03/22/2023   CHOLHDL 5 04/14/2022   Lab Results  Component Value Date   HGBA1C 5.6 03/22/2023   Lab Results  Component Value Date   VITAMINB12 1,133 03/22/2023   Lab Results  Component Value Date   TSH 1.230 11/08/2022      ASSESSMENT AND PLAN 33 y.o. year old female  has a  past medical history of Allergy, Anxiety and depression, Asthma, Dyspareunia, female, Elevated LDL cholesterol level, High cholesterol, History of DVT of lower extremity, History of HPV infection, Interstitial cystitis, Psoriasis, Seasonal allergies, Vitamin B12 deficiency, and Vitamin D  deficiency. here with:  OSA on CPAP  CPAP compliance excellent Residual AHI is good Encouraged patient to continue using CPAP nightly and > 4  hours each night F/U in 1 year or sooner if needed    Clem Currier, MSN, NP-C 07/24/2023, 10:41 AM [facility] [address] [place], [place] [address] [phone]  The patient's condition requires frequent monitoring and adjustments in the treatment plan, reflecting the ongoing complexity of care.  This provider is the continuing focal point for all needed services for this condition.

## 2023-07-24 NOTE — Telephone Encounter (Signed)
 Called and LVM for pt to call back if she would like to be seen sooner today.

## 2023-07-24 NOTE — Patient Instructions (Signed)
 Continue using CPAP nightly and greater than 4 hours each night If your symptoms worsen or you develop new symptoms please let us know.

## 2023-08-28 ENCOUNTER — Encounter: Payer: Self-pay | Admitting: Family Medicine

## 2023-08-28 NOTE — Telephone Encounter (Signed)
 I can let her know to book f/u appt, do you want her to come in fasting for lipids too? Last done Feb 2025 at another office

## 2023-09-07 ENCOUNTER — Other Ambulatory Visit: Payer: Self-pay | Admitting: Medical Genetics

## 2023-09-14 ENCOUNTER — Ambulatory Visit: Admitting: Family Medicine

## 2023-09-14 ENCOUNTER — Encounter: Payer: Self-pay | Admitting: Family Medicine

## 2023-09-14 VITALS — BP 104/70 | HR 56 | Temp 97.6°F | Ht 61.0 in | Wt 186.0 lb

## 2023-09-14 DIAGNOSIS — Z0001 Encounter for general adult medical examination with abnormal findings: Secondary | ICD-10-CM | POA: Diagnosis not present

## 2023-09-14 DIAGNOSIS — E559 Vitamin D deficiency, unspecified: Secondary | ICD-10-CM

## 2023-09-14 DIAGNOSIS — E538 Deficiency of other specified B group vitamins: Secondary | ICD-10-CM | POA: Diagnosis not present

## 2023-09-14 DIAGNOSIS — Z833 Family history of diabetes mellitus: Secondary | ICD-10-CM | POA: Diagnosis not present

## 2023-09-14 DIAGNOSIS — E669 Obesity, unspecified: Secondary | ICD-10-CM | POA: Diagnosis not present

## 2023-09-14 DIAGNOSIS — E7800 Pure hypercholesterolemia, unspecified: Secondary | ICD-10-CM

## 2023-09-14 LAB — COMPREHENSIVE METABOLIC PANEL WITH GFR
ALT: 12 U/L (ref 0–35)
AST: 13 U/L (ref 0–37)
Albumin: 4.6 g/dL (ref 3.5–5.2)
Alkaline Phosphatase: 21 U/L — ABNORMAL LOW (ref 39–117)
BUN: 10 mg/dL (ref 6–23)
CO2: 26 meq/L (ref 19–32)
Calcium: 9.2 mg/dL (ref 8.4–10.5)
Chloride: 101 meq/L (ref 96–112)
Creatinine, Ser: 0.77 mg/dL (ref 0.40–1.20)
GFR: 99.88 mL/min (ref >60.00)
Glucose, Bld: 87 mg/dL (ref 70–99)
Potassium: 4 meq/L (ref 3.5–5.1)
Sodium: 137 meq/L (ref 135–145)
Total Bilirubin: 0.7 mg/dL (ref 0.2–1.2)
Total Protein: 7.3 g/dL (ref 6.0–8.3)

## 2023-09-14 LAB — CBC WITH DIFFERENTIAL/PLATELET
Basophils Absolute: 0.1 K/uL (ref 0.0–0.1)
Basophils Relative: 0.7 % (ref 0.0–3.0)
Eosinophils Absolute: 0.1 K/uL (ref 0.0–0.7)
Eosinophils Relative: 0.8 % (ref 0.0–5.0)
HCT: 39.8 % (ref 36.0–46.0)
Hemoglobin: 13.2 g/dL (ref 12.0–15.0)
Lymphocytes Relative: 35.6 % (ref 12.0–46.0)
Lymphs Abs: 2.7 K/uL (ref 0.7–4.0)
MCHC: 33.2 g/dL (ref 30.0–36.0)
MCV: 89.3 fl (ref 78.0–100.0)
Monocytes Absolute: 0.5 K/uL (ref 0.1–1.0)
Monocytes Relative: 7.1 % (ref 3.0–12.0)
Neutro Abs: 4.3 K/uL (ref 1.4–7.7)
Neutrophils Relative %: 55.8 % (ref 43.0–77.0)
Platelets: 233 K/uL (ref 150.0–400.0)
RBC: 4.45 Mil/uL (ref 3.87–5.11)
RDW: 13.4 % (ref 11.5–15.5)
WBC: 7.6 K/uL (ref 4.0–10.5)

## 2023-09-14 LAB — LIPID PANEL
Cholesterol: 215 mg/dL — ABNORMAL HIGH (ref 0–200)
HDL: 47.9 mg/dL (ref >39.00)
LDL Cholesterol: 151 mg/dL — ABNORMAL HIGH (ref 0–99)
NonHDL: 166.63
Total CHOL/HDL Ratio: 4
Triglycerides: 78 mg/dL (ref 0.0–149.0)
VLDL: 15.6 mg/dL (ref 0.0–40.0)

## 2023-09-14 LAB — HEMOGLOBIN A1C: Hgb A1c MFr Bld: 5.6 % (ref 4.6–6.5)

## 2023-09-14 NOTE — Progress Notes (Signed)
 Complete physical exam  Patient: Daisy Singleton   DOB: 1990-09-19   33 y.o. Female  MRN: [id number]  Subjective:    Chief Complaint  Patient presents with   Annual Exam   Other providers: Psychiatrist- therapist Harlene Pepper  OB/GYN- Dr. Johnnye  [facility]- diagnosed IC Orthopedist- Dr. Dalldorf (2016 for knee issues)  Dermatologist - Ivin Domino  Eyes- [facility]    Social history: Lives with husband, works at [employer] in [employer]   Discussed the use of AI scribe software for clinical note transcription with the patient, who gave verbal consent to proceed.  History of Present Illness Daisy Singleton is a 33 year old female who presents for a preventive healthcare annual exam.  Obstructive sleep apnea - Manages condition with CPAP machine - Comfortable and compliant with CPAP use  Mental health and psychosocial stressors - Engaged in counseling and psychiatric care following family changes after grandmother's passing - Prescribed lorazepam  as needed by psychiatrist  Nutritional supplementation and neurological symptoms - Takes vitamin B12 and vitamin D  supplements - Previous symptoms of fatigue and tingling fingers resolved with supplementation  Hernia-related symptoms - Hernia on leg becomes visible and tingles after running - Symptoms managed with compression wear - No surgical intervention planned  Physical activity and lifestyle - Exercises six days per week - Participates in triathlons - Currently training for a powerlifting competition  Metabolic risk concerns - Concerned about risk of diabetes due to family history - Requests blood work to check A1c and cholesterol levels     Health Maintenance  Topic Date Due   HIV Screening  Never done   Pneumococcal Vaccine for high risk medical condition (1 of 2 - PCV) Never done   Hepatitis B Vaccine (1 of 3 - 19+ 3-dose series) Never done   HPV Vaccine (1 - Risk 3-dose SCDM  series) Never done   Flu Shot  09/07/2023   COVID-19 Vaccine (6 - Moderna risk 2024-25 season) 09/30/2023*   Pap with HPV screening  11/09/2024   DTaP/Tdap/Td vaccine (4 - Td or Tdap) 12/03/2028   Hepatitis C Screening  Completed   Meningitis B Vaccine  Aged Out  *Topic was postponed. The date shown is not the original due date.    Wears seatbelt always, uses sunscreen, smoke detectors in home and functioning, does not text while driving, feels safe in home environment.  Depression screening:    09/14/2023    2:41 PM 10/14/2021    8:24 AM 01/05/2020    8:44 AM  Depression screen PHQ 2/9  Decreased Interest 0 0 0  Down, Depressed, Hopeless 0 0 0  PHQ - 2 Score 0 0 0  Altered sleeping 0    Tired, decreased energy 0    Change in appetite 0    Feeling bad or failure about yourself  0    Trouble concentrating 0    Moving slowly or fidgety/restless 0    Suicidal thoughts 0    PHQ-9 Score 0    Difficult doing work/chores Not difficult at all     Anxiety Screening:     No data to display          Vision:Within last year and Dental: No current dental problems and Receives regular dental care  Patient Active Problem List   Diagnosis Date Noted   Other hyperlipidemia 04/01/2023   Insulin  resistance 04/01/2023   Vitamin D  deficiency 10/14/2021   Vitamin B12 deficiency 10/14/2021   Routine  general medical examination at a health care facility 10/14/2021   Snoring 12/08/2020   Night terror 12/08/2020   Weight gain 12/08/2020   Fatigue 04/01/2020   Body aches 04/01/2020   Acute nonintractable headache 04/01/2020   Intermittent right lower quadrant abdominal pain 01/05/2020   Obesity (BMI 30-39.9) 01/05/2020   Hematuria 01/05/2020   Anxiety and depression    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   Asthma    Dyspareunia, female    Elevated LDL cholesterol level    High cholesterol    History of DVT of lower extremity    History of HPV infection    LEEP in the past   Interstitial cystitis    Psoriasis    Seasonal allergies    Sleep apnea earlier this year   Am using a CPAP at night. Was mild and is now down to less than one event a night.   Vitamin B12 deficiency    Vitamin D  deficiency    Past Surgical History:  Procedure Laterality Date   CERVICAL BIOPSY  W/ LOOP ELECTRODE EXCISION     FOOT SURGERY     FRACTURE SURGERY  07/26/21   calcaneal cuboid arthrotomy   Social History   Tobacco Use   Smoking status: Never   Smokeless tobacco: Never  Vaping Use   Vaping status: Never Used  Substance Use Topics   Alcohol use: Yes    Alcohol/week: 4.0 standard drinks of alcohol    Types: 1 Cans of beer, 3 Shots of liquor per week    Comment: This is probably closer to a max, not an average.   Drug use: No      Patient Care Team: Lendia Boby CROME, NP-C as PCP - General (Family Medicine)   Outpatient Medications Prior to Visit  Medication Sig   Cyanocobalamin  (B-12 PO) Take by mouth.   Clobetasol Propionate 0.05 % shampoo As needed.   fluvoxaMINE  (LUVOX ) 100 MG tablet Take 1 tablet (100 mg total) by mouth at bedtime.   levonorgestrel (KYLEENA) 19.5 MG IUD Kyleena 17.5 mcg/24 hrs (56yrs) 19.5mg  intrauterine device  [facility]   LORazepam  (ATIVAN ) 0.5 MG tablet Take 1 tablet at bedtime and 1/2-1 tab as needed for anxiety.   PREVIDENT 5000 BOOSTER PLUS 1.1 % PSTE Toothpaste qhs   VITAMIN D  PO Take by mouth. OTC  500u daily   [DISCONTINUED] cyanocobalamin  (VITAMIN B12) 1000 MCG tablet Take 1,000 mcg by mouth daily.   No facility-administered medications prior to visit.    Review of Systems  Constitutional:  Negative for chills, fever, malaise/fatigue and weight loss.  HENT:  Negative for congestion, ear pain, sinus pain and sore throat.   Eyes:  Negative for blurred vision, double vision and pain.  Respiratory:  Negative for cough, shortness of breath and wheezing.   Cardiovascular:  Negative  for chest pain, palpitations and leg swelling.  Gastrointestinal:  Negative for abdominal pain, constipation, diarrhea, nausea and vomiting.  Genitourinary:  Negative for dysuria, frequency and urgency.  Musculoskeletal:  Negative for back pain, joint pain and myalgias.  Skin:  Negative for rash.  Neurological:  Negative for dizziness, tingling, focal weakness and headaches.  Psychiatric/Behavioral:  Negative for depression and suicidal ideas. The patient is not nervous/anxious.        Objective:    BP 104/70   Pulse (!) 56   Temp 97.6 F (36.4 C) (Temporal)   Ht 5' 1 (1.549 m)   Wt 186 lb (84.4 kg)   SpO2 100%   BMI 35.14 kg/m  BP Readings from Last 3 Encounters:  09/14/23 104/70  04/19/23 105/62  03/22/23 111/65   Wt Readings from Last 3 Encounters:  09/14/23 186 lb (84.4 kg)  04/19/23 182 lb (82.6 kg)  03/22/23 182 lb (82.6 kg)    Physical Exam Constitutional:      General: She is not in acute distress.    Appearance: She is not ill-appearing.  HENT:     Right Ear: Tympanic membrane, ear canal and external ear normal.     Left Ear: Tympanic membrane, ear canal and external ear normal.     Nose: Nose normal.     Mouth/Throat:     Mouth: Mucous membranes are moist.     Pharynx: Oropharynx is clear.  Eyes:     Extraocular Movements: Extraocular movements intact.     Conjunctiva/sclera: Conjunctivae normal.     Pupils: Pupils are equal, round, and reactive to light.  Neck:      Thyroid: No thyroid mass, thyromegaly or thyroid tenderness.  Cardiovascular:     Rate and Rhythm: Normal rate and regular rhythm.     Pulses: Normal pulses.     Heart sounds: Normal heart sounds.  Pulmonary:     Effort: Pulmonary effort is normal.     Breath sounds: Normal breath sounds.  Abdominal:     General: Bowel sounds are normal.     Palpations: Abdomen is soft.     Tenderness: There is no abdominal tenderness. There is no right CVA tenderness, left CVA tenderness, guarding or rebound.  Musculoskeletal:        General: Normal range of motion.     Cervical back: Normal range of motion and neck supple. No tenderness.     Right lower leg: No edema.     Left lower leg: No edema.  Lymphadenopathy:     Cervical: No cervical adenopathy.  Skin:    General: Skin is warm and dry.     Findings: No lesion or rash.  Neurological:     General: No focal deficit present.     Mental Status: She is alert and oriented to person, place, and time.     Cranial Nerves: No cranial nerve deficit.     Sensory: No sensory deficit.     Motor: No weakness.     Gait: Gait normal.  Psychiatric:        Mood and Affect: Mood normal.        Behavior: Behavior normal.        Thought Content: Thought content normal.      No results found for any visits on 09/14/23.    Assessment & Plan:    Routine Health Maintenance and Physical Exam  Problem List Items Addressed This Visit     Elevated LDL cholesterol level  Relevant Orders   Lipid panel   Obesity (BMI 30-39.9)   Relevant Orders   CBC with Differential/Platelet   Comprehensive metabolic panel with GFR   Lipid panel   Hemoglobin A1c   Vitamin B12 deficiency   Vitamin D  deficiency   Other Visit Diagnoses       Encounter for general adult medical examination with abnormal findings    -  Primary     Family history of diabetes mellitus in first degree relative       Relevant Orders   Hemoglobin A1c      Assessment and  Plan Assessment & Plan Adult Wellness Visit Routine adult wellness visit. She is up to date with OBGYN, dental, and eye exams. Engages in regular exercise and maintains a healthy diet. Family history of diabetes with recent diagnoses in immediate family members. Concerned about cholesterol and potential diabetes risk due to family history. - Order blood work to check cholesterol and A1c levels. - Continue current exercise and dietary habits.  Mixed hyperlipidemia Chronic condition, likely genetic, as she maintains a healthy lifestyle with regular exercise and a balanced diet. Previous dietary interventions did not significantly alter cholesterol levels. - Order blood work to assess current cholesterol levels.  Obstructive sleep apnea Uses CPAP machine effectively with no current issues reported. - Continue current CPAP therapy.  Psoriasis Psoriasis is well-controlled without medication. - Continue current management without medication.  Hernia of lower extremity Intermittent hernia in the lower extremity, noticeable post-exercise. Managed with compression. No surgical intervention planned. - Continue using compression garments as needed.   Return in about 1 year (around 09/13/2024).     Boby Mackintosh, NP-C

## 2023-09-17 ENCOUNTER — Ambulatory Visit: Payer: Self-pay | Admitting: Family Medicine

## 2023-12-17 ENCOUNTER — Encounter: Payer: Self-pay | Admitting: Family Medicine

## 2023-12-26 ENCOUNTER — Encounter: Payer: Self-pay | Admitting: Family Medicine

## 2024-02-27 ENCOUNTER — Ambulatory Visit: Admitting: Family Medicine

## 2024-07-22 ENCOUNTER — Telehealth: Admitting: Adult Health
